# Patient Record
Sex: Male | Born: 1955 | Race: White | Hispanic: No | Marital: Married | Smoking: Current every day smoker
Health system: Southern US, Community
[De-identification: ages and names within clinical notes are randomized; demographics above are authoritative.]

## PROBLEM LIST (undated history)

## (undated) DIAGNOSIS — J449 Chronic obstructive pulmonary disease, unspecified: Secondary | ICD-10-CM

## (undated) DIAGNOSIS — F419 Anxiety disorder, unspecified: Secondary | ICD-10-CM

## (undated) DIAGNOSIS — I10 Essential (primary) hypertension: Secondary | ICD-10-CM

## (undated) DIAGNOSIS — E785 Hyperlipidemia, unspecified: Secondary | ICD-10-CM

## (undated) DIAGNOSIS — N200 Calculus of kidney: Secondary | ICD-10-CM

## (undated) DIAGNOSIS — K219 Gastro-esophageal reflux disease without esophagitis: Secondary | ICD-10-CM

## (undated) DIAGNOSIS — M1990 Unspecified osteoarthritis, unspecified site: Secondary | ICD-10-CM

## (undated) DIAGNOSIS — J439 Emphysema, unspecified: Secondary | ICD-10-CM

## (undated) HISTORY — DX: Unspecified osteoarthritis, unspecified site: M19.90

## (undated) HISTORY — DX: Gastro-esophageal reflux disease without esophagitis: K21.9

## (undated) HISTORY — DX: Chronic obstructive pulmonary disease, unspecified: J44.9

## (undated) HISTORY — DX: Hyperlipidemia, unspecified: E78.5

## (undated) HISTORY — DX: Gilbert syndrome: E80.4

## (undated) HISTORY — DX: Emphysema, unspecified: J43.9

## (undated) HISTORY — DX: Essential (primary) hypertension: I10

## (undated) HISTORY — PX: CATARACT EXTRACTION: SUR2

## (undated) HISTORY — PX: COLONOSCOPY W/ POLYPECTOMY: SHX1380

## (undated) HISTORY — DX: Anxiety disorder, unspecified: F41.9

## (undated) HISTORY — DX: Calculus of kidney: N20.0

---

## 1974-07-10 DIAGNOSIS — N200 Calculus of kidney: Secondary | ICD-10-CM

## 1974-07-10 HISTORY — DX: Calculus of kidney: N20.0

## 1982-07-10 HISTORY — PX: UVULECTOMY: SHX2631

## 1982-07-10 HISTORY — PX: TONSILLECTOMY: SUR1361

## 2002-07-10 HISTORY — PX: CARDIAC CATHETERIZATION: SHX172

## 2002-10-14 ENCOUNTER — Ambulatory Visit (HOSPITAL_COMMUNITY): Admission: RE | Admit: 2002-10-14 | Discharge: 2002-10-14 | Payer: Self-pay | Admitting: Cardiology

## 2005-01-20 ENCOUNTER — Ambulatory Visit: Payer: Self-pay | Admitting: Internal Medicine

## 2005-04-10 ENCOUNTER — Ambulatory Visit: Payer: Self-pay | Admitting: Cardiology

## 2006-04-16 ENCOUNTER — Ambulatory Visit: Payer: Self-pay | Admitting: Internal Medicine

## 2006-05-11 ENCOUNTER — Ambulatory Visit: Payer: Self-pay | Admitting: Internal Medicine

## 2006-06-04 ENCOUNTER — Ambulatory Visit: Payer: Self-pay | Admitting: Internal Medicine

## 2006-06-15 ENCOUNTER — Encounter: Admission: RE | Admit: 2006-06-15 | Discharge: 2006-06-15 | Payer: Self-pay | Admitting: Internal Medicine

## 2006-12-13 ENCOUNTER — Telehealth (INDEPENDENT_AMBULATORY_CARE_PROVIDER_SITE_OTHER): Payer: Self-pay | Admitting: *Deleted

## 2006-12-14 ENCOUNTER — Telehealth: Payer: Self-pay | Admitting: Internal Medicine

## 2007-05-06 ENCOUNTER — Ambulatory Visit: Payer: Self-pay | Admitting: Internal Medicine

## 2007-05-06 LAB — CONVERTED CEMR LAB
ALT: 27 units/L (ref 0–53)
AST: 20 units/L (ref 0–37)
BUN: 10 mg/dL (ref 6–23)
Cholesterol: 190 mg/dL (ref 0–200)
Creatinine, Ser: 1 mg/dL (ref 0.4–1.5)
Glucose, Bld: 114 mg/dL — ABNORMAL HIGH (ref 70–99)
HDL: 23.6 mg/dL — ABNORMAL LOW (ref >39.0)
Potassium: 4.2 meq/L (ref 3.5–5.1)
VLDL: 53 mg/dL — ABNORMAL HIGH (ref 0–40)

## 2007-05-09 ENCOUNTER — Ambulatory Visit: Payer: Self-pay | Admitting: Internal Medicine

## 2007-05-09 DIAGNOSIS — I10 Essential (primary) hypertension: Secondary | ICD-10-CM | POA: Insufficient documentation

## 2007-05-09 DIAGNOSIS — E1169 Type 2 diabetes mellitus with other specified complication: Secondary | ICD-10-CM | POA: Insufficient documentation

## 2007-05-09 DIAGNOSIS — E782 Mixed hyperlipidemia: Secondary | ICD-10-CM | POA: Insufficient documentation

## 2007-05-09 LAB — CONVERTED CEMR LAB
Cholesterol, target level: 200 mg/dL
LDL Goal: 130 mg/dL

## 2007-05-22 ENCOUNTER — Telehealth (INDEPENDENT_AMBULATORY_CARE_PROVIDER_SITE_OTHER): Payer: Self-pay | Admitting: *Deleted

## 2007-05-31 ENCOUNTER — Ambulatory Visit: Payer: Self-pay | Admitting: Internal Medicine

## 2007-05-31 DIAGNOSIS — F419 Anxiety disorder, unspecified: Secondary | ICD-10-CM | POA: Insufficient documentation

## 2007-05-31 DIAGNOSIS — F411 Generalized anxiety disorder: Secondary | ICD-10-CM

## 2007-05-31 DIAGNOSIS — N400 Enlarged prostate without lower urinary tract symptoms: Secondary | ICD-10-CM | POA: Insufficient documentation

## 2007-06-03 ENCOUNTER — Encounter (INDEPENDENT_AMBULATORY_CARE_PROVIDER_SITE_OTHER): Payer: Self-pay | Admitting: *Deleted

## 2007-08-27 ENCOUNTER — Telehealth (INDEPENDENT_AMBULATORY_CARE_PROVIDER_SITE_OTHER): Payer: Self-pay | Admitting: *Deleted

## 2007-09-24 ENCOUNTER — Ambulatory Visit: Payer: Self-pay | Admitting: Internal Medicine

## 2007-09-26 LAB — CONVERTED CEMR LAB
ALT: 33 units/L (ref 0–53)
AST: 30 units/L (ref 0–37)
Alkaline Phosphatase: 77 units/L (ref 39–117)
BUN: 15 mg/dL (ref 6–23)
CO2: 29 meq/L (ref 19–32)
Chloride: 105 meq/L (ref 96–112)
Creatinine, Ser: 1.1 mg/dL (ref 0.4–1.5)
Eosinophils Absolute: 0.2 10*3/uL (ref 0.0–0.6)
Eosinophils Relative: 1.5 % (ref 0.0–5.0)
Hemoglobin: 15.9 g/dL (ref 13.0–17.0)
Hgb A1c MFr Bld: 6.8 % — ABNORMAL HIGH (ref 4.6–6.0)
Lymphocytes Relative: 20.8 % (ref 12.0–46.0)
MCHC: 33 g/dL (ref 30.0–36.0)
MCV: 87.4 fL (ref 78.0–100.0)
Platelets: 284 10*3/uL (ref 150–400)
Potassium: 3.7 meq/L (ref 3.5–5.1)
RBC: 5.53 M/uL (ref 4.22–5.81)
RDW: 12.4 % (ref 11.5–14.6)
Total Bilirubin: 0.8 mg/dL (ref 0.3–1.2)
Total Protein: 7 g/dL (ref 6.0–8.3)
WBC: 10.1 10*3/uL (ref 4.5–10.5)

## 2007-09-27 ENCOUNTER — Encounter (INDEPENDENT_AMBULATORY_CARE_PROVIDER_SITE_OTHER): Payer: Self-pay | Admitting: *Deleted

## 2007-10-01 ENCOUNTER — Telehealth (INDEPENDENT_AMBULATORY_CARE_PROVIDER_SITE_OTHER): Payer: Self-pay | Admitting: *Deleted

## 2007-10-02 ENCOUNTER — Telehealth (INDEPENDENT_AMBULATORY_CARE_PROVIDER_SITE_OTHER): Payer: Self-pay | Admitting: *Deleted

## 2007-10-25 ENCOUNTER — Ambulatory Visit: Payer: Self-pay | Admitting: Internal Medicine

## 2008-01-17 ENCOUNTER — Ambulatory Visit: Payer: Self-pay | Admitting: Internal Medicine

## 2008-01-17 LAB — CONVERTED CEMR LAB
Cholesterol: 142 mg/dL (ref 0–200)
HDL: 23.6 mg/dL — ABNORMAL LOW (ref >39.0)
LDL Cholesterol: 79 mg/dL (ref 0–99)
Total CHOL/HDL Ratio: 6
VLDL: 40 mg/dL (ref 0–40)

## 2008-01-24 ENCOUNTER — Ambulatory Visit: Payer: Self-pay | Admitting: Internal Medicine

## 2008-01-24 DIAGNOSIS — G5600 Carpal tunnel syndrome, unspecified upper limb: Secondary | ICD-10-CM

## 2008-01-24 DIAGNOSIS — F528 Other sexual dysfunction not due to a substance or known physiological condition: Secondary | ICD-10-CM

## 2008-09-24 ENCOUNTER — Ambulatory Visit: Payer: Self-pay | Admitting: Internal Medicine

## 2008-09-24 DIAGNOSIS — Z860100 Personal history of colon polyps, unspecified: Secondary | ICD-10-CM | POA: Insufficient documentation

## 2008-09-24 DIAGNOSIS — E119 Type 2 diabetes mellitus without complications: Secondary | ICD-10-CM

## 2008-09-28 ENCOUNTER — Encounter (INDEPENDENT_AMBULATORY_CARE_PROVIDER_SITE_OTHER): Payer: Self-pay | Admitting: *Deleted

## 2008-09-29 ENCOUNTER — Encounter (INDEPENDENT_AMBULATORY_CARE_PROVIDER_SITE_OTHER): Payer: Self-pay | Admitting: *Deleted

## 2008-12-11 ENCOUNTER — Telehealth (INDEPENDENT_AMBULATORY_CARE_PROVIDER_SITE_OTHER): Payer: Self-pay | Admitting: *Deleted

## 2009-01-25 ENCOUNTER — Ambulatory Visit: Payer: Self-pay | Admitting: Internal Medicine

## 2009-01-31 LAB — CONVERTED CEMR LAB
Cholesterol: 172 mg/dL (ref 0–200)
Direct LDL: 88.7 mg/dL
HDL: 25.2 mg/dL — ABNORMAL LOW (ref >39.00)
Total CHOL/HDL Ratio: 7
Triglycerides: 389 mg/dL — ABNORMAL HIGH (ref 0.0–149.0)
VLDL: 77.8 mg/dL — ABNORMAL HIGH (ref 0.0–40.0)

## 2009-02-01 ENCOUNTER — Encounter (INDEPENDENT_AMBULATORY_CARE_PROVIDER_SITE_OTHER): Payer: Self-pay | Admitting: *Deleted

## 2009-10-01 ENCOUNTER — Ambulatory Visit: Payer: Self-pay | Admitting: Internal Medicine

## 2009-10-01 DIAGNOSIS — Z87442 Personal history of urinary calculi: Secondary | ICD-10-CM

## 2009-10-01 DIAGNOSIS — F17200 Nicotine dependence, unspecified, uncomplicated: Secondary | ICD-10-CM

## 2010-05-02 ENCOUNTER — Encounter: Payer: Self-pay | Admitting: Internal Medicine

## 2010-08-07 LAB — CONVERTED CEMR LAB
ALT: 57 units/L — ABNORMAL HIGH (ref 0–53)
AST: 21 units/L (ref 0–37)
Albumin: 4 g/dL (ref 3.5–5.2)
BUN: 15 mg/dL (ref 6–23)
BUN: 16 mg/dL (ref 6–23)
Basophils Absolute: 0.1 10*3/uL (ref 0.0–0.1)
Basophils Relative: 1 % (ref 0.0–3.0)
Bilirubin Urine: NEGATIVE
CO2: 26 meq/L (ref 19–32)
CO2: 28 meq/L (ref 19–32)
Calcium: 9.1 mg/dL (ref 8.4–10.5)
Chloride: 106 meq/L (ref 96–112)
Chloride: 108 meq/L (ref 96–112)
Cholesterol: 148 mg/dL (ref 0–200)
Creatinine, Ser: 1.1 mg/dL (ref 0.4–1.5)
Creatinine,U: 126.1 mg/dL
Creatinine,U: 137.4 mg/dL
Direct LDL: 90.5 mg/dL
Eosinophils Absolute: 0.1 10*3/uL (ref 0.0–0.7)
Eosinophils Relative: 2.2 % (ref 0.0–5.0)
Glucose, Bld: 126 mg/dL — ABNORMAL HIGH (ref 70–99)
Glucose, Bld: 79 mg/dL (ref 70–99)
HCT: 45.5 % (ref 39.0–52.0)
HCT: 46.4 % (ref 39.0–52.0)
Hemoglobin: 15.9 g/dL (ref 13.0–17.0)
Hgb A1c MFr Bld: 6 % (ref 4.6–6.5)
Ketones, urine, test strip: NEGATIVE
Lymphocytes Relative: 27.5 % (ref 12.0–46.0)
Lymphocytes Relative: 28.7 % (ref 12.0–46.0)
Lymphs Abs: 1.8 10*3/uL (ref 0.7–4.0)
Microalb Creat Ratio: 5.6 mg/g (ref 0.0–30.0)
Microalb Creat Ratio: 7.3 mg/g (ref 0.0–30.0)
Microalb, Ur: 0.7 mg/dL (ref 0.0–1.9)
Microalb, Ur: 1 mg/dL (ref 0.0–1.9)
Monocytes Absolute: 0.6 10*3/uL (ref 0.1–1.0)
Monocytes Relative: 7.6 % (ref 3.0–12.0)
Neutro Abs: 4.2 10*3/uL (ref 1.4–7.7)
Neutrophils Relative %: 61 % (ref 43.0–77.0)
Neutrophils Relative %: 61.9 % (ref 43.0–77.0)
Nitrite: NEGATIVE
PSA: 0.46 ng/mL (ref 0.10–4.00)
PSA: 0.67 ng/mL (ref 0.10–4.00)
Platelets: 254 10*3/uL (ref 150.0–400.0)
Potassium: 4 meq/L (ref 3.5–5.1)
Potassium: 4.2 meq/L (ref 3.5–5.1)
RBC: 5.27 M/uL (ref 4.22–5.81)
RDW: 13.1 % (ref 11.5–14.6)
Sodium: 142 meq/L (ref 135–145)
Sodium: 143 meq/L (ref 135–145)
TSH: 1.66 microintl units/mL (ref 0.35–5.50)
Total Bilirubin: 0.4 mg/dL (ref 0.3–1.2)
Total CHOL/HDL Ratio: 5
Total Protein: 6.8 g/dL (ref 6.0–8.3)
Triglycerides: 229 mg/dL — ABNORMAL HIGH (ref 0.0–149.0)
Triglycerides: 277 mg/dL — ABNORMAL HIGH (ref 0.0–149.0)
VLDL: 45.8 mg/dL — ABNORMAL HIGH (ref 0.0–40.0)
WBC Urine, dipstick: NEGATIVE
WBC: 6.7 10*3/uL (ref 4.5–10.5)
pH: 6.5

## 2010-08-09 NOTE — Assessment & Plan Note (Signed)
Summary: CPX WITH FASTING LABS/N/S/SCM   Vital Signs:  Patient profile:   55 year old male Height:      72 inches Weight:      223 pounds BMI:     30.35 Temp:     97.8 degrees F oral Pulse rate:   64 / minute Resp:     14 per minute BP sitting:   128 / 70  (left arm) Cuff size:   large  Vitals Entered By: Shonna Chock (October 01, 2009 8:30 AM)  CC: CPX with fasting labs , General Medical Evaluation Comments REVIEWED MED LIST, PATIENT AGREED DOSE AND INSTRUCTION CORRECT    CC:  CPX with fasting labs  and General Medical Evaluation.  History of Present Illness: [name] is here for a physical; he is asymptomatic. Not checking glucoses; no hypoglycemia or significant weight change.  Preventive Screening-Counseling & Management  Caffeine-Diet-Exercise     Does Patient Exercise: no  Allergies (verified): No Known Drug Allergies  Past History:  Past Medical History: Hyperlipidemia Hypertension Gilbert's Syndrome; isolated Alkaline  Phosphatase elevation 2001 Diabetes mellitus, type II Colonic polyps, hx of, 2007, Dr Marina Goodell Nephrolithiasis, hx of  Past Surgical History: Catheterization  2004 mild CAD; uvulectomy for  Sleep Apnea (OSA) with Tonsillectomy renal calculi, passed 1976 Colon polypectomy 2007, due 2012  Family History: Father: prostate cancer, dysrhythmia Mother: HTN, DM, Alsheimer's Siblings: none; M uncle CBAG; M aunt COAD,CAD; M uncle MI @ 90; M aunt Alsheimer's; P aunts cancer ? primary  Social History: Current Smoker:1 ppd No specific diet but decreased meat & sugar Occupation: Garment/textile technologist Married Alcohol use-no Regular exercise-no Does Patient Exercise:  no  Review of Systems General:  Denies fatigue, sleep disorder, and weight loss. Eyes:  Denies blurring, double vision, and vision loss-both eyes; Last exam 2008. ENT:  Denies difficulty swallowing and hoarseness. CV:  Denies chest pain or discomfort, difficulty breathing at night,  difficulty breathing while lying down, leg cramps with exertion, lightheadness, near fainting, palpitations, swelling of feet, and swelling of hands; DOE with high level exercise. Resp:  Complains of cough; denies chest pain with inspiration, coughing up blood, excessive snoring, hypersomnolence, morning headaches, shortness of breath, and sputum productive; Slight wheezing. GI:  Denies abdominal pain, bloody stools, dark tarry stools, and indigestion. GU:  Denies discharge, dysuria, and hematuria. MS:  Denies joint pain, low back pain, mid back pain, and thoracic pain. Derm:  Denies changes in nail beds, dryness, hair loss, lesion(s), and poor wound healing. Neuro:  Complains of numbness; denies tingling; Numbness in LUE after sleeping in LLDP with LUE behind head. Psych:  Denies anxiety and depression. Endo:  Denies cold intolerance, excessive hunger, excessive thirst, excessive urination, and heat intolerance. Heme:  Denies abnormal bruising and bleeding. Allergy:  Denies itching eyes and sneezing.  Physical Exam  General:  well-nourished; alert,appropriate and cooperative throughout examination Head:  Normocephalic and atraumatic without obvious abnormalities. Eyes:  No corneal or conjunctival inflammation noted. Perrla. Funduscopic exam benign, without hemorrhages, exudates or papilledema. Ears:  External ear exam shows no significant lesions or deformities.  Otoscopic examination reveals clear canals, tympanic membranes are intact bilaterally without bulging, retraction, inflammation or discharge. Hearing is grossly normal bilaterally.TMs scarred Nose:  External nasal examination shows no deformity or inflammation. Nasal mucosa are pink and moist without lesions or exudates. Mouth:  Oral mucosa and oropharynx without lesions or exudates.  Teeth in  poor  repair; caries of  L upper tooth . Neck:  No  deformities, masses, or tenderness noted. Lungs:  Normal respiratory effort, chest expands  symmetrically. Lungs are clear to auscultation, no crackles or wheezes. Heart:  Normal rate and regular rhythm. S1 and S2 normal without gallop, murmur, click, rub .S4 Abdomen:  Bowel sounds positive,abdomen soft and non-tender without masses, organomegaly or hernias noted. Rectal:  External tags  noted. Normal sphincter tone. No rectal masses or tenderness. Genitalia:  Testes bilaterally descended without nodularity, tenderness or masses. No scrotal masses or lesions. No penis lesions or urethral discharge. Prostate:  Prostate gland firm and smooth, no enlargement, nodularity, tenderness, mass, asymmetry or induration. Msk:  No deformity or scoliosis noted of thoracic or lumbar spine.   Pulses:  R and L carotid,radial,femoral,dorsalis pedis and posterior tibial pulses are full and equal bilaterally Extremities:  No clubbing, cyanosis, edema, or deformity noted with normal full range of motion of all joints. Mild crepitus knees.    Neurologic:  alert & oriented X3, sensation intact to light touch over feet, gait normal, and DTRs symmetrical and normal.   Skin:  2X2 cm verrucoid lesion over dorsal  R 3rd MCP joint Cervical Nodes:  No lymphadenopathy noted Axillary Nodes:  No palpable lymphadenopathy Inguinal Nodes:  No significant adenopathy Psych:  memory intact for recent and remote, normally interactive, and good eye contact.     Impression & Recommendations:  Problem # 1:  ROUTINE GENERAL MEDICAL EXAM@HEALTH  CARE FACL (ICD-V70.0)  Orders: EKG w/ Interpretation (93000) Venipuncture (27253) TLB-Lipid Panel (80061-LIPID) TLB-BMP (Basic Metabolic Panel-BMET) (80048-METABOL) TLB-CBC Platelet - w/Differential (85025-CBCD) TLB-Hepatic/Liver Function Pnl (80076-HEPATIC) TLB-TSH (Thyroid Stimulating Hormone) (84443-TSH) TLB-Microalbumin/Creat Ratio, Urine (82043-MALB) TLB-A1C / Hgb A1C (Glycohemoglobin) (83036-A1C) TLB-PSA (Prostate Specific Antigen) (84153-PSA)  Problem # 2:  DIABETES  MELLITUS, TYPE II (ICD-250.00)  His updated medication list for this problem includes:    Lisinopril 10 Mg Tabs (Lisinopril) .Marland Kitchen... Take one tablet daily    Metformin Hcl 500 Mg Tb24 (Metformin hcl) .Marland Kitchen... 1 two times a day with 2 largest meals  Orders: Venipuncture (66440) TLB-Microalbumin/Creat Ratio, Urine (82043-MALB) TLB-A1C / Hgb A1C (Glycohemoglobin) (83036-A1C)  Problem # 3:  HYPERTENSION, ESSENTIAL NOS (ICD-401.9)  controlled His updated medication list for this problem includes:    Lisinopril 10 Mg Tabs (Lisinopril) .Marland Kitchen... Take one tablet daily  Orders: Venipuncture (34742)  Problem # 4:  HYPERLIPIDEMIA (ICD-272.2)  His updated medication list for this problem includes:    Niaspan 500 Mg Tbcr (Niacin (antihyperlipidemic)) .Marland Kitchen... Take one tablet daily    Crestor 20 Mg Tabs (Rosuvastatin calcium) .Marland Kitchen... 1 once daily mostly  Orders: Venipuncture (59563) TLB-Lipid Panel (80061-LIPID)  Problem # 5:  SMOKER (ICD-305.1)   CV risks  discussed  Orders: Venipuncture (87564)  Problem # 6:  HYPERPLASIA PROSTATE UNS W/O UR OBST & OTH LUTS (ICD-600.90)  PMH of,not documented on DRE. FH prostate cancer  Orders: Venipuncture (33295) TLB-PSA (Prostate Specific Antigen) (84153-PSA)  Problem # 7:  FAMILY HISTORY OF MALIGNANT NEOPLASM PROSTATE (ICD-V16.42)  Orders: Venipuncture (18841) TLB-PSA (Prostate Specific Antigen) (84153-PSA)  Complete Medication List: 1)  Niaspan 500 Mg Tbcr (Niacin (antihyperlipidemic)) .... Take one tablet daily 2)  Advair Diskus 100-50 Mcg/dose Misc (Fluticasone-salmeterol) .... Use one puff every 12 hours as needed 3)  Lisinopril 10 Mg Tabs (Lisinopril) .... Take one tablet daily 4)  Ecasa 81 Mg  .... Once daily 5)  Folic Acid  .... Prn 6)  Crestor 20 Mg Tabs (Rosuvastatin calcium) .Marland Kitchen.. 1 once daily mostly 7)  Metformin Hcl 500 Mg Tb24 (Metformin hcl) .Marland KitchenMarland KitchenMarland Kitchen 1  two times a day with 2 largest meals 8)  Freestyle Lite Strp (Glucose blood) .... Test  one time daily 9)  Freestyle Lancets Misc (Lancets) .... Use one daily as directed 10)  Levitra 20 Mg Tabs (Vardenafil hcl) .... 1/2 -1 once daily prn 11)  Doxycycline Hyclate 100 Mg Tabs (Doxycycline hyclate) .... Take 1 tab  two times a day. avoid direct sun on this med.  Patient Instructions: 1)  Please have caries addressed. 2)  Stop Smoking Tips: Choose a Quit date. Cut down before the Quit date. decide what you will do as a substitute when you feel the urge to smoke(gum,toothpick,exercise). 3)  Take an Aspirin every day. 4)  Check your blood sugars regularly. If your readings are usually above :150  or below 90 you should contact our office. 5)  See your eye doctor yearly to check for diabetic eye damage. 6)  Check your feet each night for sore areas, calluses or signs of infection. Prescriptions: FREESTYLE LANCETS   MISC (LANCETS) use one daily as directed  #100 x 3   Entered and Authorized by:   Marga Melnick MD   Signed by:   Marga Melnick MD on 10/01/2009   Method used:   Print then Give to Patient   RxID:   [id number] FREESTYLE LITE   STRP (GLUCOSE BLOOD) test one time daily  #100 x 3   Entered and Authorized by:   Marga Melnick MD   Signed by:   Marga Melnick MD on 10/01/2009   Method used:   Print then Give to Patient   RxID:   [id number] METFORMIN HCL 500 MG  TB24 (METFORMIN HCL) 1 two times a day with 2 largest meals  #180 Each x 0   Entered and Authorized by:   Marga Melnick MD   Signed by:   Marga Melnick MD on 10/01/2009   Method used:   Print then Give to Patient   RxID:   [id number] CRESTOR 20 MG  TABS (ROSUVASTATIN CALCIUM) 1 once daily mostly  #30 x 11   Entered and Authorized by:   Marga Melnick MD   Signed by:   Marga Melnick MD on 10/01/2009   Method used:   Print then Give to Patient   RxID:   [id number] LISINOPRIL 10 MG  TABS (LISINOPRIL) take one tablet daily  #90 x 3   Entered and Authorized by:   Marga Melnick MD    Signed by:   Marga Melnick MD on 10/01/2009   Method used:   Print then Give to Patient   RxID:   [id number] ADVAIR DISKUS 100-50 MCG/DOSE  MISC (FLUTICASONE-SALMETEROL) use one puff every 12 hours as needed  #1 x 11   Entered and Authorized by:   Marga Melnick MD   Signed by:   Marga Melnick MD on 10/01/2009   Method used:   Print then Give to Patient   RxID:   [id number] NIASPAN 500 MG  TBCR (NIACIN (ANTIHYPERLIPIDEMIC)) take one tablet daily  #90 x 3   Entered and Authorized by:   Marga Melnick MD   Signed by:   Marga Melnick MD on 10/01/2009   Method used:   Print then Give to Patient   RxID:   [id number]

## 2010-08-12 NOTE — Medication Information (Signed)
Summary: Letter Regarding Adding a Statin/[facility]  Letter Regarding Adding a Statin/[facility]   Imported By: Lanelle Bal 06/01/2010 12:27:56  _____________________________________________________________________  External Attachment:    Type:   Image     Comment:   External Document

## 2010-09-03 ENCOUNTER — Encounter: Payer: Self-pay | Admitting: Internal Medicine

## 2010-10-13 ENCOUNTER — Encounter: Payer: Self-pay | Admitting: Internal Medicine

## 2010-10-13 ENCOUNTER — Ambulatory Visit (INDEPENDENT_AMBULATORY_CARE_PROVIDER_SITE_OTHER): Payer: BC Managed Care – PPO | Admitting: Internal Medicine

## 2010-10-13 VITALS — BP 122/68 | HR 72 | Temp 98.5°F | Resp 14 | Ht 72.0 in | Wt 213.6 lb

## 2010-10-13 DIAGNOSIS — E782 Mixed hyperlipidemia: Secondary | ICD-10-CM

## 2010-10-13 DIAGNOSIS — I10 Essential (primary) hypertension: Secondary | ICD-10-CM

## 2010-10-13 DIAGNOSIS — E785 Hyperlipidemia, unspecified: Secondary | ICD-10-CM

## 2010-10-13 DIAGNOSIS — F17200 Nicotine dependence, unspecified, uncomplicated: Secondary | ICD-10-CM

## 2010-10-13 DIAGNOSIS — Z0000 Encounter for general adult medical examination without abnormal findings: Secondary | ICD-10-CM

## 2010-10-13 DIAGNOSIS — E119 Type 2 diabetes mellitus without complications: Secondary | ICD-10-CM

## 2010-10-13 DIAGNOSIS — N400 Enlarged prostate without lower urinary tract symptoms: Secondary | ICD-10-CM

## 2010-10-13 LAB — CBC WITH DIFFERENTIAL/PLATELET
Basophils Relative: 1 % (ref 0.0–3.0)
Eosinophils Relative: 1.6 % (ref 0.0–5.0)
Lymphocytes Relative: 24.6 % (ref 12.0–46.0)
Lymphs Abs: 2.1 10*3/uL (ref 0.7–4.0)
MCV: 87.9 fl (ref 78.0–100.0)
Monocytes Absolute: 0.7 10*3/uL (ref 0.1–1.0)
Monocytes Relative: 7.8 % (ref 3.0–12.0)
Neutro Abs: 5.6 10*3/uL (ref 1.4–7.7)
Neutrophils Relative %: 65 % (ref 43.0–77.0)
Platelets: 323 10*3/uL (ref 150.0–400.0)
RBC: 5.51 Mil/uL (ref 4.22–5.81)
RDW: 14 % (ref 11.5–14.6)
WBC: 8.6 10*3/uL (ref 4.5–10.5)

## 2010-10-13 LAB — HEPATIC FUNCTION PANEL
Alkaline Phosphatase: 68 U/L (ref 39–117)
Total Bilirubin: 1 mg/dL (ref 0.3–1.2)
Total Protein: 6.8 g/dL (ref 6.0–8.3)

## 2010-10-13 LAB — LIPID PANEL: Triglycerides: 283 mg/dL — ABNORMAL HIGH (ref 0.0–149.0)

## 2010-10-13 LAB — BASIC METABOLIC PANEL
Calcium: 9.5 mg/dL (ref 8.4–10.5)
Chloride: 105 mEq/L (ref 96–112)
Potassium: 4.9 mEq/L (ref 3.5–5.1)
Sodium: 141 mEq/L (ref 135–145)

## 2010-10-13 LAB — HEMOGLOBIN A1C: Hgb A1c MFr Bld: 6.4 % (ref 4.6–6.5)

## 2010-10-13 MED ORDER — LISINOPRIL 10 MG PO TABS: 10.0000 mg | ORAL_TABLET | Freq: Every day | ORAL | Status: DC

## 2010-10-13 NOTE — Patient Instructions (Signed)
Please think about quitting smoking. Review the risks we discussed. Please call 1-800-QUIT-NOW 956-067-0951) for free smoking cessation counseling.   Preventive Health Care: Exercise at least 30-45 minutes a day,  3-4 days a week.  Eat a low-fat diet with lots of fruits and vegetables, up to 7-9 servings per day. Avoid obesity; your goal is waist measurement < 40 inches.Consume less than 40 grams of sugar per day from foods & drinks with High Fructose Corn Sugar as #2,3 or # 4 on label. Seatbelts can save your life. Wear them always. Smoke detectors on every level of your home, check batteries every year. Eye Doctor - have an eye exam @ least annually.     Please see your Dentist ASAP.                                                                                  Diabetes Monitor   The A1c test checks the average amount of glucose (sugar) in the blood over the last 2 to 3 months.As glucose circulates in the blood, some of it binds to hemoglobin A. This is the main form of hemoglobin in adults. Hemoglobin is a red protein that carries oxygen in the red blood cells (RBC's). Once the glucose is bound to the hemoglobin A, it remains there for the life of the red blood cell (about 120 days). This combination of glucose and hemoglobin A is called A1c (or hemoglobin A1c or glycohemoglobin). Increased glucose in the blood, increases the hemoglobin A1c. A1c levels do not change quickly but will shift as older RBC's die and younger ones take their place.   The A1c test is used primarily to monitor the glucose control of diabetics over time. The goal of those with diabetes is to keep their blood glucose levels as close to normal as possible. This helps to minimize the complications caused by chronically elevated glucose levels, such as progressive damage to body organs like the kidneys, eyes, cardiovascular system, and nerves. The A1c test gives a picture of the average amount of glucose in the blood over the  last few months. It can help a patient and his doctor know if the measures they are taking to control the patient's diabetes are successful or need to be adjusted.     Depending on the type of diabetes that you have, how well your diabetes is controlled, your A1c may be measured 2 to 4 times each year. When someone is first diagnosed with diabetes or if control is not good, A1c may be ordered more frequently.   NORMAL VALUES  Non diabetic adults: 5 %-6.1%  Good diabetic control: 6.2-6.4 %  Fair diabetic control: 6.5-7%  Poor diabetic control: greater than 7 % ( except with additional factors such as  advanced age; significant coronary or neurologic disease,etc). Check the A1c every 6 months if it is < 6.5%; every 4 months if  6.5% or higher. Goals for home glucose monitoring are : fasting  or morning glucose goal of  90-150. Two hours after any meal , goal = < 180, preferably < 150.

## 2010-10-13 NOTE — Progress Notes (Signed)
Subjective:    Patient ID: JHOEL STIEG, male    DOB: [birth year], 55 y.o.   MRN: [id number]  HPI  He is here for a physical; he denies symptoms.  HYPERTENSION  Disease Monitoring: Blood pressure range-118/68 average Chest pain, palpitations- no      Dyspnea- no  Medications: Compliance- yes Lightheadedness,Syncope- no   Edema- no   DIABETES  Disease Monitoring: Blood Sugar ranges-wife has record; he is unsure of values Polyuria/phagia/dipsia- no      Visual problems- no  Medications: Compliance- yes Hypoglycemic symptoms- no    HYPERLIPIDEMIA  Disease Monitoring: See symptoms for Hypertension  Medications: Compliance- Niaspan causes "joint pain" , taken irregularly. Abd pain, bowel changes- no  Muscle aches- no( see above)       Smoking Status :1 ppd       Review of Systems Patient reports no  vision/ hearing changes,anorexia, weight change, fever ,adenopathy, persistant / recurrent hoarseness, swallowing issues, persistant / recurrent cough, hemoptysis,  paroxysmal nocturnal), gastrointestinal  bleeding (melena, rectal bleeding), abdominal pain, excessive heart burn, GU symptoms( dysuria, hematuria, pyuria, voiding/incontinence  issues)  focal weakness, memory loss,numbness & tingling, skin/hair/nail changes,depression, anxiety, abnormal bruising/bleeding, musculoskeletal symptoms/signs.      Objective:   Physical Exam General appearance is one of good nourishment. Skull is normocephalic without lymphadenopathy about the head, neck, or axilla. See current vital signs Eye - Pupils Equal Round Reactive to light, Extraocular movements intact, Fundi without hemorrhage or visible lesions, Conjunctiva without redness or discharge.Ears:  External ear exam shows no significant lesions or deformities.  Otoscopic examination reveals clear canals, tympanic membranes are intact bilaterally without bulging, retraction, inflammation or discharge. Hearing is grossly normal  bilaterally Oral exam: Dental hygiene is fair with isolated caries; lips and gums are healthy appearing.There is no oropharyngeal erythema or exudate noted. Marland KitchenHeart:  Normal rate and regular rhythm. S1 and S2 normal without gallop, murmur, click, rub or other extra sounds.                                                                                                      Lungs: low grade musical wheezes w/o  rhonchi,rales ,or rubs present.No increased work of breathing. Abdomen: bowel sounds normal, soft and non-tender without masses, organomegaly or hernias noted.  No guarding or rebound Genitourinary: Testicles are normal with no associated lesions. The penis is uncircumsized, mild glans  erythema. Rectal tone is normal. The prostate is  Flat & firm w/o nodule present.Musculoskeletal/Extremities: Gait and station; range of motion; stability; muscle strength; and  muscle tone are normal. Nail health is good. There are  no significant deformities of the digits. No cyanosis, clubbing or edema are present.  Neuro: sensation to light touch over feet was normal ; deep tendon reflexes were normal; gait  normal; balance normal.Skin:  Intact without suspicious lesions ; candida in groin. Warty growth dorsum R hand @ 3rd R MCP.Psych:  Cognition and judgment appear intact. Alert, communicative  and cooperative with normal attention span and concentration.  Assessment & Plan:  #1 comprehensive physical exam. Issues which  need immediate attention would be the caries (risks discussed). Smoking cessation was discussed but he is not ready to do this. The risks were reviewed. Changes in the prostate and the family history necessitate  PSA monitor .   #2 diabetes  #3 hypertension, controlled  #4 lipidemia  #5 probable Candida dermatitis.  Plan: Extensive labs to evaluate risk outlined above.

## 2010-10-15 ENCOUNTER — Other Ambulatory Visit: Payer: Self-pay | Admitting: Internal Medicine

## 2010-10-19 ENCOUNTER — Encounter: Payer: Self-pay | Admitting: Internal Medicine

## 2010-10-24 ENCOUNTER — Other Ambulatory Visit: Payer: Self-pay | Admitting: Internal Medicine

## 2010-11-14 ENCOUNTER — Other Ambulatory Visit: Payer: Self-pay | Admitting: Internal Medicine

## 2010-11-14 MED ORDER — ROSUVASTATIN CALCIUM 20 MG PO TABS: 20.0000 mg | ORAL_TABLET | Freq: Every day | ORAL | Status: DC

## 2010-11-25 NOTE — Cardiovascular Report (Signed)
   Shane Adams, Shane Adams                          ACCOUNT NO.:  [id number]   MEDICAL RECORD NO.:  [id number]                   PATIENT TYPE:  OIB   LOCATION:  2899                                 FACILITY:  [facility]   PHYSICIAN:  Salvadore Farber, M.D. [facility]         DATE OF BIRTH:  May 29, 1956   DATE OF PROCEDURE:  10/14/2002  DATE OF DISCHARGE:                              CARDIAC CATHETERIZATION   PROCEDURE:  Left heart catheterization, left ventriculography, coronary  angiography.   INDICATIONS:  The patient is a 55 year old gentleman with longstanding  tobacco use who presents with dyspnea.  After electrocardiogram at physical  examination demonstrated nonspecific ST-T abnormalities, he was referred for  exercise tolerance testing that demonstrated a questionable apical defect.  He was therefore referred for diagnostic angiography.   DIAGNOSTIC TECHNIQUE:  Informed consent was obtained.  Under 1% lidocaine  local anesthesia, a 6-French sheath was placed in the right femoral artery  using the modified Seldinger technique.  Coronary angiography and  ventriculography were performed using JL4, JR4, and pigtail catheters.  The  patient tolerated the procedure well and was transferred to the holding room  in stable condition.  Sheaths are to be removed there.   COMPLICATIONS:  None.   FINDINGS:  1. LV:  135/10/17.  EF:  62% without regional wall motion abnormalities.  2. No aortic stenosis or mitral regurgitation.  3. Left Main:  Angiographically normal.  4. LAD:  The LAD is a moderate-sized vessel, giving rise to a single     diagonal branch.  There is a 30% stenosis of the proximal LAD.  5. Ramus intermedius:  A small vessel which is angiographically normal.  6. Circumflex:  The circumflex is a large, dominant vessel.  It gives rise     to 2 obtuse marginal branches in the PDA.  It is angiographically normal.  7. RCA:  A very small nondominant vessel without significant disease.   IMPRESSION AND RECOMMENDATIONS:  The patient has mild coronary artery  disease with normal left ventricular pressures and systolic function.  These  do not explain his exertional dyspnea.  Nonetheless, he does have mild  disease.  The aggressive, preventative efforts underway should certainly be  continued.  I emphasized to the patient the critical importance of smoking  cessation.                                               Salvadore Farber, M.D. [facility]    WED/MEDQ  D:  10/14/2002  T:  10/15/2002  Job:  [id number]   cc:   Titus Dubin. Alwyn Ren, M.D. [facility]

## 2011-03-16 ENCOUNTER — Other Ambulatory Visit: Payer: Self-pay | Admitting: Internal Medicine

## 2011-05-16 ENCOUNTER — Encounter: Payer: Self-pay | Admitting: Internal Medicine

## 2011-05-26 ENCOUNTER — Encounter: Payer: Self-pay | Admitting: Internal Medicine

## 2011-06-13 ENCOUNTER — Ambulatory Visit (AMBULATORY_SURGERY_CENTER): Payer: BC Managed Care – HMO | Admitting: *Deleted

## 2011-06-13 VITALS — Ht 72.0 in | Wt 205.0 lb

## 2011-06-13 DIAGNOSIS — Z1211 Encounter for screening for malignant neoplasm of colon: Secondary | ICD-10-CM

## 2011-06-13 MED ORDER — METOCLOPRAMIDE HCL 10 MG PO TABS: 10.0000 mg | ORAL_TABLET | ORAL | Status: DC

## 2011-06-13 MED ORDER — PEG-KCL-NACL-NASULF-NA ASC-C 100 G PO SOLR: ORAL | Status: DC

## 2011-06-27 ENCOUNTER — Encounter: Payer: Self-pay | Admitting: Internal Medicine

## 2011-06-27 ENCOUNTER — Ambulatory Visit (AMBULATORY_SURGERY_CENTER): Payer: BC Managed Care – HMO | Admitting: Internal Medicine

## 2011-06-27 VITALS — BP 149/69 | HR 78 | Temp 97.1°F | Resp 15 | Ht 72.0 in | Wt 205.0 lb

## 2011-06-27 DIAGNOSIS — D126 Benign neoplasm of colon, unspecified: Secondary | ICD-10-CM

## 2011-06-27 DIAGNOSIS — Z1211 Encounter for screening for malignant neoplasm of colon: Secondary | ICD-10-CM

## 2011-06-27 DIAGNOSIS — Z86010 Personal history of colonic polyps: Secondary | ICD-10-CM

## 2011-06-27 MED ORDER — SODIUM CHLORIDE 0.9 % IV SOLN: 500.0000 mL | INTRAVENOUS | Status: DC

## 2011-06-27 NOTE — Patient Instructions (Signed)
HANDOUTS ON POLYPS AND HEMORRHOIDS  DISCHARGE INSTRUCTIONS PER BLUE AND GREEN SHEETS  WE WILL MAIL YOU A LETTER IN 1-2 WEEKS WITH THE PATHOLOGY RESULTS AND DR PERRY'S RECOMMENDATIONS

## 2011-06-27 NOTE — Progress Notes (Signed)
Patient did not experience any of the following events: a burn prior to discharge; a fall within the facility; wrong site/side/patient/procedure/implant event; or a hospital transfer or hospital admission upon discharge from the facility. (G8907) Patient did not have preoperative order for IV antibiotic SSI prophylaxis. (G8918)  

## 2011-06-27 NOTE — Op Note (Signed)
[facility] [address] [place], [place]  [address]  COLONOSCOPY PROCEDURE REPORT  PATIENT:  Shane Adams, Shane Adams  MR#:  [id number] BIRTHDATE:  [birth year], 55 yrs. old  GENDER:  male ENDOSCOPIST:  Wilhemina Bonito. Eda Keys, MD REF. BY:  Surveillance Program Recall, PROCEDURE DATE:  06/27/2011 PROCEDURE:  Colonoscopy with snare polypectomy x 3 ASA CLASS:  Class II INDICATIONS:  history of pre-cancerous (adenomatous) colon polyps, surveillance and high-risk screening ; index 05-2006 w/ multiple adenomas MEDICATIONS:   MAC sedation, administered by CRNA, propofol (Diprivan) 300 mg IV  DESCRIPTION OF PROCEDURE:   After the risks benefits and alternatives of the procedure were thoroughly explained, informed consent was obtained.  Digital rectal exam was performed and revealed no abnormalities.   The LB CF-[id number] endoscope was introduced through the anus and advanced to the cecum, which was identified by both the appendix and ileocecal valve, without limitations.  The quality of the prep was excellent, using MoviPrep.  The instrument was then slowly withdrawn as the colon was fully examined. <<PROCEDUREIMAGES>>  FINDINGS:  Three polyps, < 1 cm, were found in the ascending (2) and transverse colon. Polyps were snared without cautery. Retrieval was successful. Otherwise normal colonoscopy without other polyps, masses, vascular ectasias, or inflammatory changes. Retroflexed views in the rectum revealed internal hemorrhoids. The time to cecum = 2  minutes. The scope was then withdrawn in 13:56 minutes from the cecum and the procedure completed.  COMPLICATIONS:  None  ENDOSCOPIC IMPRESSION: 1) Three polyps in the  colon - removed 2) Otherwise normal colonoscopy 3) Internal hemorrhoids  RECOMMENDATIONS: 1) Follow up colonoscopy in 5 years  ______________________________ Wilhemina Bonito. Eda Keys, MD  CC:  Pecola Lawless, MD;  The Patient  n. eSIGNED:   Wilhemina Bonito. Eda Keys at  06/27/2011 08:59 AM  Lenda Kelp, [phone]

## 2011-06-28 ENCOUNTER — Telehealth: Payer: Self-pay

## 2011-06-28 NOTE — Telephone Encounter (Signed)
Left message on answering machine. 

## 2011-09-10 ENCOUNTER — Other Ambulatory Visit: Payer: Self-pay | Admitting: Internal Medicine

## 2011-09-11 NOTE — Telephone Encounter (Signed)
Prescription sent for 30 days.  Patient is due for CPX 10/2011.

## 2011-09-25 ENCOUNTER — Telehealth: Payer: Self-pay | Admitting: Internal Medicine

## 2011-09-25 NOTE — Telephone Encounter (Signed)
Can you send another prescription for 60-days of Metformin to get patient thru thi his May Physical  Also needs lisinopril 90-days if possible  Pharmacy

## 2011-09-26 MED ORDER — LISINOPRIL 10 MG PO TABS: ORAL_TABLET | ORAL | Status: DC

## 2011-09-26 MED ORDER — METFORMIN HCL 500 MG PO TABS: ORAL_TABLET | ORAL | Status: DC

## 2011-09-26 NOTE — Telephone Encounter (Signed)
RXs sent.

## 2011-10-15 ENCOUNTER — Other Ambulatory Visit: Payer: Self-pay | Admitting: Internal Medicine

## 2011-10-16 ENCOUNTER — Other Ambulatory Visit: Payer: Self-pay | Admitting: *Deleted

## 2011-10-16 MED ORDER — METFORMIN HCL 500 MG PO TABS: ORAL_TABLET | ORAL | Status: DC

## 2011-10-16 NOTE — Telephone Encounter (Signed)
Rx sent 

## 2011-11-15 ENCOUNTER — Other Ambulatory Visit: Payer: Self-pay | Admitting: Internal Medicine

## 2011-11-15 MED ORDER — LISINOPRIL 10 MG PO TABS: ORAL_TABLET | ORAL | Status: DC

## 2011-11-15 NOTE — Telephone Encounter (Signed)
Addended by: Maurice Small on: 11/15/2011 05:02 PM   Modules accepted: Orders

## 2011-11-28 ENCOUNTER — Other Ambulatory Visit: Payer: Self-pay | Admitting: Internal Medicine

## 2011-11-30 ENCOUNTER — Encounter: Payer: BC Managed Care – HMO | Admitting: Internal Medicine

## 2011-12-12 ENCOUNTER — Encounter: Payer: Self-pay | Admitting: Internal Medicine

## 2011-12-12 ENCOUNTER — Encounter: Payer: BC Managed Care – HMO | Admitting: *Deleted

## 2011-12-20 ENCOUNTER — Ambulatory Visit (INDEPENDENT_AMBULATORY_CARE_PROVIDER_SITE_OTHER): Payer: BC Managed Care – PPO | Admitting: Internal Medicine

## 2011-12-20 ENCOUNTER — Encounter: Payer: Self-pay | Admitting: Internal Medicine

## 2011-12-20 VITALS — BP 118/64 | HR 59 | Temp 97.8°F | Resp 16 | Ht 71.0 in | Wt 206.4 lb

## 2011-12-20 DIAGNOSIS — Z136 Encounter for screening for cardiovascular disorders: Secondary | ICD-10-CM

## 2011-12-20 DIAGNOSIS — Z0000 Encounter for general adult medical examination without abnormal findings: Secondary | ICD-10-CM

## 2011-12-20 LAB — LIPID PANEL
HDL: 34.8 mg/dL — ABNORMAL LOW (ref >39.00)
LDL Cholesterol: 113 mg/dL — ABNORMAL HIGH (ref 0–99)
Total CHOL/HDL Ratio: 5
VLDL: 31.4 mg/dL (ref 0.0–40.0)

## 2011-12-20 LAB — TSH: TSH: 1.56 u[IU]/mL (ref 0.35–5.50)

## 2011-12-20 LAB — HEPATIC FUNCTION PANEL
Alkaline Phosphatase: 62 U/L (ref 39–117)
Bilirubin, Direct: 0 mg/dL (ref 0.0–0.3)
Total Protein: 7 g/dL (ref 6.0–8.3)

## 2011-12-20 LAB — MICROALBUMIN / CREATININE URINE RATIO
Creatinine,U: 175.2 mg/dL
Microalb, Ur: 1.2 mg/dL (ref 0.0–1.9)

## 2011-12-20 LAB — CBC WITH DIFFERENTIAL/PLATELET
Basophils Absolute: 0.1 10*3/uL (ref 0.0–0.1)
Hemoglobin: 16.2 g/dL (ref 13.0–17.0)
Lymphocytes Relative: 23.1 % (ref 12.0–46.0)
Monocytes Relative: 6.5 % (ref 3.0–12.0)
Neutro Abs: 6.4 10*3/uL (ref 1.4–7.7)
RBC: 5.52 Mil/uL (ref 4.22–5.81)
RDW: 14.3 % (ref 11.5–14.6)

## 2011-12-20 LAB — BASIC METABOLIC PANEL
Calcium: 9.2 mg/dL (ref 8.4–10.5)
GFR: 63.1 mL/min (ref >60.00)
Glucose, Bld: 104 mg/dL — ABNORMAL HIGH (ref 70–99)
Sodium: 140 mEq/L (ref 135–145)

## 2011-12-20 LAB — HEMOGLOBIN A1C: Hgb A1c MFr Bld: 6.3 % (ref 4.6–6.5)

## 2011-12-20 MED ORDER — METFORMIN HCL 500 MG PO TABS: ORAL_TABLET | ORAL | Status: DC

## 2011-12-20 MED ORDER — FLUTICASONE-SALMETEROL 100-50 MCG/DOSE IN AEPB: 1.0000 | INHALATION_SPRAY | Freq: Two times a day (BID) | RESPIRATORY_TRACT | Status: DC

## 2011-12-20 NOTE — Patient Instructions (Addendum)
Preventive Health Care: Exercise at least 30-45 minutes a day,  3-4 days a week.  Eat a low-fat diet with lots of fruits and vegetables, up to 7-9 servings per day. Avoid obesity; your goal is waist measurement < 40 inches.Consume less than 40 grams of sugar per day from foods & drinks with High Fructose Corn Sugar as # 1,2,3 or # 4 on label. Eye Doctor - have an eye exam @ least annually.                                                          Please try to go on My Chart within the next 24 hours to allow me to release the results directly to you.  

## 2011-12-20 NOTE — Progress Notes (Signed)
  Subjective:    Patient ID: Shane Adams, male    DOB: 1956-06-24, 56 y.o.   MRN: [id number]  HPI   Shane Adams  is here for a physical; he denies acute issues.      Review of Systems HYPERTENSION: Disease Monitoring: Blood pressure range-not monitored  Chest pain, palpitations-no      Dyspnea- only with running Medications: Compliance- yes  Lightheadedness,Syncope- no    Edema-no  DIABETES: Disease Monitoring: Blood Sugar ranges-not monitored  Polyuria/phagia/dipsia- no       Visual problems- no Medications: Compliance- yes Hypoglycemic symptoms- no  HYPERLIPIDEMIA: Disease Monitoring: See symptoms for Hypertension Medications: Compliance- Crestor stopped by him, "no reason"  Abd pain, bowel changes- no   Muscle aches- no  He describes some mood change with being quick to anger since his been unemployed recently.         Objective:   Physical Exam Gen.:  well-nourished in appearance. Alert, appropriate and cooperative throughout exam. Head: Normocephalic without obvious abnormalities Eyes: No corneal or conjunctival inflammation noted. Pupils equal round reactive to light and accommodation. Fundal exam is benign without hemorrhages, exudate, papilledema. Extraocular motion intact. Vision grossly normal. Ears: External  ear exam reveals no significant lesions or deformities. Canals clear .TMs normal. Hearing is minimally decreased on L  Nose: External nasal exam reveals no deformity or inflammation. Nasal mucosa are pink and moist. No lesions or exudates noted.   Mouth: Oral mucosa and oropharynx reveal no lesions or exudates. Teeth in poor repair; severe caries LU tooth. Neck: No deformities, masses, or tenderness noted. Range of motion & Thyroid normal Lungs: Normal respiratory effort; chest expands symmetrically. Lungs are clear to auscultation without rales, wheezes, or increased work of breathing. Decreased BS Heart: Normal rate and rhythm. Normal S1 and S2.  No gallop, click, or rub.S 4 w/o  Murmur. Distant heart sounds Abdomen: Bowel sounds normal; abdomen soft and nontender. No masses, organomegaly . Ventral  hernia noted. Genitalia/ DRE: Genitalia normal.Prostate is normal without enlargement, asymmetry, nodularity, or induration.  Note: His wife has requested a PSA. Standard of care discussed              Musculoskeletal/extremities: No deformity or scoliosis noted of  the thoracic or lumbar spine; but there is some asymmetry of the posterior thoracic musculature suggesting occult scoliosis.  No clubbing, cyanosis, edema, or deformity noted. Range of motion  normal .Tone & strength  normal.Joints normal. Nail health  good. Vascular: Carotid, radial artery, dorsalis pedis and  posterior tibial pulses are full and equal. No bruits present. Neurologic: Alert and oriented x3. Deep tendon reflexes symmetrical and normal.          Skin: Intact without suspicious lesions or rashes. Lymph: No cervical, axillary, or inguinal lymphadenopathy present. Psych: Mood and affect are normal. Normally interactive                                                                                         Assessment & Plan:  #1 comprehensive physical exam; no acute findings #2 see Problem List with Assessments & Recommendations Plan: see Orders

## 2011-12-25 ENCOUNTER — Encounter: Payer: Self-pay | Admitting: *Deleted

## 2012-02-07 ENCOUNTER — Encounter: Payer: BC Managed Care – HMO | Admitting: Internal Medicine

## 2012-08-08 ENCOUNTER — Other Ambulatory Visit: Payer: Self-pay | Admitting: Internal Medicine

## 2012-09-10 ENCOUNTER — Encounter: Payer: BC Managed Care – PPO | Admitting: Physical Therapy

## 2012-11-12 ENCOUNTER — Other Ambulatory Visit: Payer: Self-pay | Admitting: Internal Medicine

## 2013-01-07 ENCOUNTER — Ambulatory Visit (INDEPENDENT_AMBULATORY_CARE_PROVIDER_SITE_OTHER): Payer: BC Managed Care – PPO | Admitting: Internal Medicine

## 2013-01-07 ENCOUNTER — Encounter: Payer: Self-pay | Admitting: Internal Medicine

## 2013-01-07 VITALS — BP 118/70 | HR 54 | Temp 98.0°F | Resp 12 | Ht 72.0 in | Wt 191.0 lb

## 2013-01-07 DIAGNOSIS — F39 Unspecified mood [affective] disorder: Secondary | ICD-10-CM

## 2013-01-07 DIAGNOSIS — E119 Type 2 diabetes mellitus without complications: Secondary | ICD-10-CM

## 2013-01-07 DIAGNOSIS — G4733 Obstructive sleep apnea (adult) (pediatric): Secondary | ICD-10-CM | POA: Insufficient documentation

## 2013-01-07 DIAGNOSIS — E785 Hyperlipidemia, unspecified: Secondary | ICD-10-CM

## 2013-01-07 DIAGNOSIS — F17200 Nicotine dependence, unspecified, uncomplicated: Secondary | ICD-10-CM

## 2013-01-07 DIAGNOSIS — Z0000 Encounter for general adult medical examination without abnormal findings: Secondary | ICD-10-CM

## 2013-01-07 DIAGNOSIS — Z23 Encounter for immunization: Secondary | ICD-10-CM

## 2013-01-07 DIAGNOSIS — R4586 Emotional lability: Secondary | ICD-10-CM

## 2013-01-07 LAB — CBC WITH DIFFERENTIAL/PLATELET
Basophils Absolute: 0 10*3/uL (ref 0.0–0.1)
Basophils Relative: 0.3 % (ref 0.0–3.0)
Eosinophils Absolute: 0.3 10*3/uL (ref 0.0–0.7)
HCT: 48 % (ref 39.0–52.0)
Hemoglobin: 15.9 g/dL (ref 13.0–17.0)
Lymphocytes Relative: 21.4 % (ref 12.0–46.0)
Lymphs Abs: 2.4 10*3/uL (ref 0.7–4.0)
MCHC: 33.1 g/dL (ref 30.0–36.0)
Neutro Abs: 8.2 10*3/uL — ABNORMAL HIGH (ref 1.4–7.7)
RBC: 5.34 Mil/uL (ref 4.22–5.81)
RDW: 15.4 % — ABNORMAL HIGH (ref 11.5–14.6)

## 2013-01-07 LAB — BASIC METABOLIC PANEL
CO2: 24 mEq/L (ref 19–32)
Calcium: 9.9 mg/dL (ref 8.4–10.5)
Chloride: 108 mEq/L (ref 96–112)
Glucose, Bld: 113 mg/dL — ABNORMAL HIGH (ref 70–99)
Potassium: 4.8 mEq/L (ref 3.5–5.1)
Sodium: 142 mEq/L (ref 135–145)

## 2013-01-07 LAB — TSH: TSH: 1.56 u[IU]/mL (ref 0.35–5.50)

## 2013-01-07 LAB — HEPATIC FUNCTION PANEL
ALT: 12 U/L (ref 0–53)
Albumin: 4.2 g/dL (ref 3.5–5.2)
Alkaline Phosphatase: 60 U/L (ref 39–117)
Bilirubin, Direct: 0.2 mg/dL (ref 0.0–0.3)
Total Protein: 7.1 g/dL (ref 6.0–8.3)

## 2013-01-07 LAB — MICROALBUMIN / CREATININE URINE RATIO
Microalb Creat Ratio: 0.7 mg/g (ref 0.0–30.0)
Microalb, Ur: 1.1 mg/dL (ref 0.0–1.9)

## 2013-01-07 LAB — LIPID PANEL
Total CHOL/HDL Ratio: 6
Triglycerides: 167 mg/dL — ABNORMAL HIGH (ref 0.0–149.0)

## 2013-01-07 MED ORDER — METFORMIN HCL 500 MG PO TABS: ORAL_TABLET | ORAL | Status: DC

## 2013-01-07 MED ORDER — LISINOPRIL 10 MG PO TABS: ORAL_TABLET | ORAL | Status: DC

## 2013-01-07 MED ORDER — BUPROPION HCL ER (SR) 150 MG PO TB12: ORAL_TABLET | ORAL | Status: DC

## 2013-01-07 MED ORDER — FLUTICASONE-SALMETEROL 100-50 MCG/DOSE IN AEPB: 1.0000 | INHALATION_SPRAY | Freq: Two times a day (BID) | RESPIRATORY_TRACT | Status: DC

## 2013-01-07 NOTE — Patient Instructions (Addendum)
Take the EKG to any emergency room or preop visits. There are nonspecific changes; as long as there is no new change these are not clinically significant . If the old EKG is not available for comparison; it may result in unnecessary hospitalization for observation with significant unnecessary expense. Repeat the isometric exercises discussed 4- 5 times prior to standing if you've been seated for a period of time.  If you activate the  My Chart system; lab & Xray results will be released directly  to you as soon as I review & address these through the computer. If you choose not to sign up for My Chart within 36 hours of labs being drawn; results will be reviewed & interpretation added before being copied & mailed, causing a delay in getting the results to you.If you do not receive that report within 7-10 days ,please call. Additionally you can use this system to gain direct  access to your records  if  out of town or @ an office of a  physician who is not in  the My Chart network.  This improves continuity of care & places you in control of your medical record.

## 2013-01-07 NOTE — Progress Notes (Signed)
  Subjective:    Patient ID: IHAN PAT, male    DOB: 02-Jun-1956, 57 y.o.   MRN: [id number]  HPI  He is here for a physical;acute issues include mood swings, especially anger & impatience.     Review of Systems HYPERTENSION: Disease Monitoring: Blood pressure range-not monitored  Chest pain, palpitations- no      Dyspnea- improved with weight loss & increased exercise Medications: Compliance-yes  Lightheadedness,Syncope- occasional positional lightheadedness    Edema- no  DIABETES: Disease Monitoring: Blood Sugar ranges-no monitor  Polyuria/phagia/dipsia- no     Visual problems- no; Ophth exam needed Medications: Compliance- no  Hypoglycemic symptoms- no  HYPERLIPIDEMIA: Disease Monitoring: See symptoms for Hypertension Medications: Compliance- no statin ; erroneous hx of Zocor allergy( Tricor caused hives)   Abd pain, bowel changes-no  Muscle aches- no          Objective:   Physical Exam Gen.:  well-nourished in appearance. Alert, appropriate and cooperative throughout exam. Head: Normocephalic without obvious abnormalities; moustache ; male pattern alopecia  Eyes: No corneal or conjunctival inflammation noted. Pupils equal round reactive to light and accommodation.  Extraocular motion intact. Vision grossly normal without lenses Ears: External  ear exam reveals no significant lesions or deformities. Some wax on R. L TM normal. Hearing is grossly normal bilaterally. Nose: External nasal exam reveals no deformity or inflammation. Nasal mucosa are pink and moist. No lesions or exudates noted.  Mouth: Oral mucosa and oropharynx reveal no lesions or exudates. Teeth in fair-poor repair. Neck: No deformities, masses, or tenderness noted. Range of motion & Thyroid normal. Lungs: Normal respiratory effort; chest expands symmetrically. Lungs are clear to auscultation without rales, wheezes, or increased work of breathing. Heart: Slow rate and regular rhythm. Normal S1  and S2. No gallop, click, or rub. No murmur. Abdomen: Bowel sounds normal; abdomen soft and nontender. No masses, organomegaly or hernias noted. Genitalia: Genitalia normal except for left varices.Hemorrhoidal tags present. Prostate is normal without enlargement, asymmetry, nodularity, or induration.                           Musculoskeletal/extremities: There is slight asymmetry of the posterior thoracic musculature suggesting occult scoliosis. No clubbing, cyanosis, edema, or significant extremity  deformity noted. Range of motion normal .Tone & strength  Normal. Joints normal. Nail health good; post traumatic hyperpigmentation R great nail Able to lie down & sit up w/o help. Negative SLR bilaterally Vascular: Carotid, radial artery, dorsalis pedis and  posterior tibial pulses are full and equal. No bruits present. Neurologic: Alert and oriented x3. Deep tendon reflexes symmetrical and normal.  Light touch normal over feet.       Skin: Intact without suspicious lesions or rashes. Lymph: No cervical, axillary, or inguinal lymphadenopathy present. Psych: Mood and affect are normal. Normally interactive                                                                                        Assessment & Plan:  #1 comprehensive physical exam; no acute findings  Plan: see Orders  & Recommendations

## 2013-01-13 ENCOUNTER — Encounter: Payer: Self-pay | Admitting: *Deleted

## 2013-07-09 ENCOUNTER — Other Ambulatory Visit: Payer: Self-pay | Admitting: Internal Medicine

## 2013-07-09 NOTE — Telephone Encounter (Signed)
Metformin refilled per protocol. JG//CMA 

## 2013-09-18 ENCOUNTER — Other Ambulatory Visit: Payer: Self-pay | Admitting: Internal Medicine

## 2014-01-08 ENCOUNTER — Other Ambulatory Visit (INDEPENDENT_AMBULATORY_CARE_PROVIDER_SITE_OTHER): Payer: BC Managed Care – PPO

## 2014-01-08 ENCOUNTER — Ambulatory Visit (INDEPENDENT_AMBULATORY_CARE_PROVIDER_SITE_OTHER)
Admission: RE | Admit: 2014-01-08 | Discharge: 2014-01-08 | Disposition: A | Discharge status: Home / Self Care | Payer: BC Managed Care – PPO | Source: Ambulatory Visit | Attending: Internal Medicine | Admitting: Internal Medicine

## 2014-01-08 ENCOUNTER — Encounter: Payer: Self-pay | Admitting: Internal Medicine

## 2014-01-08 ENCOUNTER — Ambulatory Visit (INDEPENDENT_AMBULATORY_CARE_PROVIDER_SITE_OTHER): Payer: BC Managed Care – PPO | Admitting: Internal Medicine

## 2014-01-08 VITALS — BP 120/66 | HR 51 | Temp 97.9°F | Ht 72.0 in | Wt 185.0 lb

## 2014-01-08 DIAGNOSIS — Z0000 Encounter for general adult medical examination without abnormal findings: Secondary | ICD-10-CM

## 2014-01-08 DIAGNOSIS — Z23 Encounter for immunization: Secondary | ICD-10-CM

## 2014-01-08 DIAGNOSIS — E119 Type 2 diabetes mellitus without complications: Secondary | ICD-10-CM

## 2014-01-08 DIAGNOSIS — F17200 Nicotine dependence, unspecified, uncomplicated: Secondary | ICD-10-CM

## 2014-01-08 DIAGNOSIS — I10 Essential (primary) hypertension: Secondary | ICD-10-CM

## 2014-01-08 DIAGNOSIS — E782 Mixed hyperlipidemia: Secondary | ICD-10-CM

## 2014-01-08 LAB — CBC WITH DIFFERENTIAL/PLATELET
BASOS ABS: 0.1 10*3/uL (ref 0.0–0.1)
Basophils Relative: 0.8 % (ref 0.0–3.0)
Eosinophils Absolute: 0.3 10*3/uL (ref 0.0–0.7)
Eosinophils Relative: 2.9 % (ref 0.0–5.0)
HCT: 45.8 % (ref 39.0–52.0)
HEMOGLOBIN: 15.4 g/dL (ref 13.0–17.0)
LYMPHS PCT: 19 % (ref 12.0–46.0)
Lymphs Abs: 2.1 10*3/uL (ref 0.7–4.0)
MCHC: 33.7 g/dL (ref 30.0–36.0)
MCV: 88.4 fl (ref 78.0–100.0)
Monocytes Absolute: 0.7 10*3/uL (ref 0.1–1.0)
Monocytes Relative: 6.4 % (ref 3.0–12.0)
NEUTROS ABS: 8 10*3/uL — AB (ref 1.4–7.7)
Neutrophils Relative %: 70.9 % (ref 43.0–77.0)
Platelets: 603 10*3/uL — ABNORMAL HIGH (ref 150.0–400.0)
RBC: 5.18 Mil/uL (ref 4.22–5.81)
RDW: 15.6 % — AB (ref 11.5–15.5)
WBC: 11.3 10*3/uL — ABNORMAL HIGH (ref 4.0–10.5)

## 2014-01-08 LAB — HEMOGLOBIN A1C: Hgb A1c MFr Bld: 6.1 % (ref 4.6–6.5)

## 2014-01-08 LAB — BASIC METABOLIC PANEL
BUN: 16 mg/dL (ref 6–23)
CALCIUM: 9.5 mg/dL (ref 8.4–10.5)
CO2: 29 meq/L (ref 19–32)
CREATININE: 1.2 mg/dL (ref 0.4–1.5)
Chloride: 107 mEq/L (ref 96–112)
GFR: 68.3 mL/min (ref >60.00)
GLUCOSE: 114 mg/dL — AB (ref 70–99)
Potassium: 5.3 mEq/L — ABNORMAL HIGH (ref 3.5–5.1)
SODIUM: 140 meq/L (ref 135–145)

## 2014-01-08 LAB — LIPID PANEL
CHOLESTEROL: 158 mg/dL (ref 0–200)
HDL: 33.7 mg/dL — AB (ref >39.00)
LDL Cholesterol: 108 mg/dL — ABNORMAL HIGH (ref 0–99)
NonHDL: 124.3
Total CHOL/HDL Ratio: 5
Triglycerides: 84 mg/dL (ref 0.0–149.0)
VLDL: 16.8 mg/dL (ref 0.0–40.0)

## 2014-01-08 LAB — HEPATIC FUNCTION PANEL
ALBUMIN: 3.9 g/dL (ref 3.5–5.2)
ALT: 16 U/L (ref 0–53)
AST: 18 U/L (ref 0–37)
Alkaline Phosphatase: 56 U/L (ref 39–117)
Bilirubin, Direct: 0.2 mg/dL (ref 0.0–0.3)
Total Bilirubin: 1.1 mg/dL (ref 0.2–1.2)
Total Protein: 6.6 g/dL (ref 6.0–8.3)

## 2014-01-08 LAB — MICROALBUMIN / CREATININE URINE RATIO
Creatinine,U: 202.8 mg/dL
MICROALB/CREAT RATIO: 0.7 mg/g (ref 0.0–30.0)
Microalb, Ur: 1.4 mg/dL (ref 0.0–1.9)

## 2014-01-08 LAB — PSA: PSA: 1.06 ng/mL (ref 0.10–4.00)

## 2014-01-08 LAB — TSH: TSH: 1.08 u[IU]/mL (ref 0.35–4.50)

## 2014-01-08 MED ORDER — LORAZEPAM 0.5 MG PO TABS: ORAL_TABLET | ORAL | Status: DC

## 2014-01-08 NOTE — Patient Instructions (Signed)
Your next office appointment will be determined based upon review of your pending labs & x-rays. Those instructions will be transmitted to you through My Chart . 

## 2014-01-08 NOTE — Progress Notes (Signed)
Pre visit review using our clinic review tool, if applicable. No additional management support is needed unless otherwise documented below in the visit note. 

## 2014-01-08 NOTE — Progress Notes (Signed)
Subjective:    Patient ID: Shane Adams, male    DOB: 09/06/1955, 58 y.o.   MRN: [id number]  HPI  He  is here for a physical;acute issues denied.   He follows no specific nutritional program. Despite this he has lost 40 pounds by decreasing portions and with increased activity on his job. He is physically active at his job at least 3 hours a day.  He does not monitor his blood pressure or his glucoses.  3-4 weeks ago his metformin was decreased to one half twice a day as he felt "weak". Otherwise he has been compliant with his medications.  His wife states he is very irritable. The more he smokes, the more irritable he becomes. Wellbutrin had been prescribed, but he did not like the way this made him feel and stopped it. He had been prescribed other agents in the past which she's also not continued.     Review of Systems   Denied are chest pain, palpitations, edema, claudication, lightheadedness, or syncope. He has no blurred vision, double vision, loss of vision  He does have some numbness in his hands but there is no burning or tingling in the feet.  He has no nonhealing skin lesions  He has no abdominal pain or bowel changes  Myalgias are denied.  Does not have a memory loss, but his wife states he does repeat himself often.     Objective:   Physical Exam   Significant or distinguishing  findings on physical exam are documented first.  Below that are other systems examined & findings.  Pattern alopecia is present  Has wax in right otic canal.  He has multiple missing teeth.  Thyroid is not palpable as a substernal.  Breath sounds are generally decreased.  Ventral hernia is present  Dorsalis pedis pulses are decreased.  Hemorrhoidal to present.  The prostate is not enlarged and there is no nodularity. There is slight asymmetry with the right lobe greater than the left.       Gen.: Adequately nourished in appearance. Alert, appropriate and cooperative  throughout exam.  Head: Normocephalic without obvious abnormalities. Eyes: No corneal or conjunctival inflammation noted. Pupils equal round reactive to light and accommodation. Extraocular motion intact.  Ears: External  ear exam reveals no significant lesions or deformities.  Hearing is grossly normal bilaterally. Nose: External nasal exam reveals no deformity or inflammation. Nasal mucosa are pink and moist. No lesions or exudates noted.   Mouth: Oral mucosa and oropharynx reveal no lesions or exudates.  Neck: No deformities, masses, or tenderness noted. Range of motion good. Lungs: Normal respiratory effort; chest expands symmetrically. Lungs are clear to auscultation without rales, wheezes, or increased work of breathing. Heart: Normal rate and rhythm. Normal S1 and S2. No gallop, click, or rub. No murmur. Abdomen: Bowel sounds normal; abdomen soft and nontender. No masses, organomegaly or hernias noted. Genitalia: Genitalia normal except for left varices. Musculoskeletal/extremities: No deformity or scoliosis noted of  the thoracic or lumbar spine.   No clubbing, cyanosis, edema, or significant extremity  deformity noted. Range of motion normal .Tone & strength normal. Hand joints normal  Fingernail / toenail health good. Able to lie down & sit up w/o help. Negative SLR bilaterally Vascular: Carotid, radial artery, dorsalis pedis and  posterior tibial pulses are equal. No bruits present. Neurologic: Alert and oriented x3. Deep tendon reflexes symmetrical and normal.  Gait normal .       Skin: Intact without suspicious lesions  or rashes. Lymph: No cervical, axillary, or inguinal lymphadenopathy present. Psych: Mood and affect are normal. Normally interactive                                                                                        Assessment & Plan:  #1 comprehensive physical exam; no acute findings  Plan: see Orders  & Recommendations

## 2014-01-09 ENCOUNTER — Other Ambulatory Visit: Payer: Self-pay | Admitting: Internal Medicine

## 2014-01-09 DIAGNOSIS — R9389 Abnormal findings on diagnostic imaging of other specified body structures: Secondary | ICD-10-CM

## 2014-01-09 DIAGNOSIS — R7303 Prediabetes: Secondary | ICD-10-CM

## 2014-01-09 DIAGNOSIS — F17200 Nicotine dependence, unspecified, uncomplicated: Secondary | ICD-10-CM

## 2014-01-14 ENCOUNTER — Encounter: Payer: Self-pay | Admitting: Internal Medicine

## 2014-01-15 ENCOUNTER — Other Ambulatory Visit: Payer: Self-pay | Admitting: Internal Medicine

## 2014-01-15 MED ORDER — SERTRALINE HCL 25 MG PO TABS: 25.0000 mg | ORAL_TABLET | Freq: Every day | ORAL | Status: DC

## 2014-01-16 NOTE — Telephone Encounter (Signed)
Phone call from patient spouse regarding the additional testing needed. I left her a voicemail to call back. Mychart msg from Dr Linna Darner states the reasoning for additional testing.

## 2014-01-20 ENCOUNTER — Ambulatory Visit (INDEPENDENT_AMBULATORY_CARE_PROVIDER_SITE_OTHER)
Admission: RE | Admit: 2014-01-20 | Discharge: 2014-01-20 | Disposition: A | Discharge status: Home / Self Care | Payer: BC Managed Care – PPO | Source: Ambulatory Visit | Attending: Internal Medicine | Admitting: Internal Medicine

## 2014-01-20 DIAGNOSIS — R9389 Abnormal findings on diagnostic imaging of other specified body structures: Secondary | ICD-10-CM

## 2014-01-20 DIAGNOSIS — F17200 Nicotine dependence, unspecified, uncomplicated: Secondary | ICD-10-CM

## 2014-02-03 ENCOUNTER — Other Ambulatory Visit: Payer: Self-pay | Admitting: Internal Medicine

## 2014-02-03 ENCOUNTER — Encounter: Payer: Self-pay | Admitting: Internal Medicine

## 2014-02-03 MED ORDER — METFORMIN HCL 500 MG PO TABS: ORAL_TABLET | ORAL | Status: DC

## 2014-02-03 MED ORDER — LISINOPRIL 10 MG PO TABS: ORAL_TABLET | ORAL | Status: DC

## 2014-02-03 MED ORDER — LORAZEPAM 0.5 MG PO TABS: ORAL_TABLET | ORAL | Status: DC

## 2014-02-04 ENCOUNTER — Telehealth: Payer: Self-pay

## 2014-02-04 MED ORDER — METFORMIN HCL 500 MG PO TABS: ORAL_TABLET | ORAL | Status: DC

## 2014-02-04 MED ORDER — FLUTICASONE-SALMETEROL 100-50 MCG/DOSE IN AEPB: 1.0000 | INHALATION_SPRAY | Freq: Two times a day (BID) | RESPIRATORY_TRACT | Status: DC

## 2014-02-04 MED ORDER — LISINOPRIL 10 MG PO TABS: ORAL_TABLET | ORAL | Status: DC

## 2014-02-04 NOTE — Telephone Encounter (Signed)
Message copied by Shelly Coss on Wed Feb 04, 2014  9:23 AM ------      Message from: Hendricks Limes      Created: Wed Feb 04, 2014  9:20 AM       Please see My Chart message; please renew meds X 6 mos ( except Lorazepam only #30). Metformin is 500 mg 1/2 qd.      I thought I  Sent these to [facility] as per chart but they use different pharmacy. Thanks, Hopp ------

## 2014-02-13 ENCOUNTER — Encounter: Payer: Self-pay | Admitting: Internal Medicine

## 2014-03-10 ENCOUNTER — Encounter: Payer: Self-pay | Admitting: Internal Medicine

## 2014-03-10 ENCOUNTER — Other Ambulatory Visit: Payer: Self-pay | Admitting: Internal Medicine

## 2014-03-10 NOTE — Telephone Encounter (Signed)
Prescription has been electronically sent to [facility] in [place]

## 2014-03-10 NOTE — Telephone Encounter (Signed)
OK # [phone] 

## 2014-04-21 ENCOUNTER — Telehealth: Payer: Self-pay | Admitting: Internal Medicine

## 2014-04-21 NOTE — Telephone Encounter (Signed)
I would need to see him after 6 months; refill X 3 mos

## 2014-04-21 NOTE — Telephone Encounter (Signed)
Pt was wondering if Dr. Linna Darner want him to follow up to get Sertraline refill, he is doing very good with this medication. Please advise. Call [employer]

## 2014-04-22 MED ORDER — SERTRALINE HCL 25 MG PO TABS: ORAL_TABLET | ORAL | Status: DC

## 2014-04-22 NOTE — Telephone Encounter (Signed)
Notified Jeenie via [employer]. Sertraline has been sent to [facility] in [place]

## 2014-06-08 ENCOUNTER — Ambulatory Visit (INDEPENDENT_AMBULATORY_CARE_PROVIDER_SITE_OTHER): Payer: BC Managed Care – PPO

## 2014-06-08 ENCOUNTER — Ambulatory Visit (INDEPENDENT_AMBULATORY_CARE_PROVIDER_SITE_OTHER): Payer: BC Managed Care – PPO | Admitting: Internal Medicine

## 2014-06-08 ENCOUNTER — Other Ambulatory Visit (INDEPENDENT_AMBULATORY_CARE_PROVIDER_SITE_OTHER): Payer: BC Managed Care – PPO

## 2014-06-08 ENCOUNTER — Encounter: Payer: Self-pay | Admitting: Internal Medicine

## 2014-06-08 VITALS — BP 132/60 | HR 54 | Temp 98.2°F | Wt 188.2 lb

## 2014-06-08 DIAGNOSIS — F411 Generalized anxiety disorder: Secondary | ICD-10-CM

## 2014-06-08 DIAGNOSIS — R7309 Other abnormal glucose: Secondary | ICD-10-CM

## 2014-06-08 DIAGNOSIS — R7303 Prediabetes: Secondary | ICD-10-CM

## 2014-06-08 DIAGNOSIS — E782 Mixed hyperlipidemia: Secondary | ICD-10-CM

## 2014-06-08 DIAGNOSIS — Z720 Tobacco use: Secondary | ICD-10-CM

## 2014-06-08 DIAGNOSIS — Z23 Encounter for immunization: Secondary | ICD-10-CM

## 2014-06-08 DIAGNOSIS — J439 Emphysema, unspecified: Secondary | ICD-10-CM

## 2014-06-08 DIAGNOSIS — F17200 Nicotine dependence, unspecified, uncomplicated: Secondary | ICD-10-CM

## 2014-06-08 LAB — HEMOGLOBIN A1C: HEMOGLOBIN A1C: 6.2 % (ref 4.6–6.5)

## 2014-06-08 MED ORDER — SERTRALINE HCL 25 MG PO TABS: ORAL_TABLET | ORAL | Status: DC

## 2014-06-08 MED ORDER — METFORMIN HCL 500 MG PO TABS: ORAL_TABLET | ORAL | Status: DC

## 2014-06-08 NOTE — Progress Notes (Signed)
Pre visit review using our clinic review tool, if applicable. No additional management support is needed unless otherwise documented below in the visit note. 

## 2014-06-08 NOTE — Progress Notes (Signed)
   Subjective:    Patient ID: Shane Adams, male    DOB: May 04, 1956, 58 y.o.   MRN: [id number]  HPI   He is here in follow-up of dyslipidemia risk; his insurance company  had recommend consideration of a statin.  In July of this year his LDL was 108 and HDL 32.7.  The basis for the recommendation was that he is on metformin therapy  He is on extremely low doses of metformin prophylactically at the request of his wife who is a Marine scientist. His A1c in July was 6.1% to be prediabetic  Managed care also recommended a bronchodilator. He does continue to smoke but has decreased to a half pack per day. He is he has a new job and is not able to smoke @ new location. He uses Advair up to 3 times a week. He's not had to use a rescue inhaler.   He denies active cardiac symptoms at this time.  He feels of the sertraline is of great benefit as does his wife.  Review of Systems    Chest pain, palpitations, tachycardia, exertional dyspnea, paroxysmal nocturnal dyspnea, claudication or edema are absent.        Objective:   Physical Exam  Positive or pertinent findings include: He has a mustache; pattern alopecia is present He has isolated severe caries. Breath sounds are decreased; there is no increased work of breathing The first heart sound is accentuated ;he has an S4 .   General appearance :adequately nourished; in no distress. Eyes: No conjunctival inflammation or scleral icterus is present. Oral exam: Lips and gums are healthy appearing.There is no oropharyngeal erythema or exudate noted.  Heart:  Normal rate and regular rhythm. S2 normal without gallop, murmur, click, rub or other extra sounds   Lungs:Chest clear to auscultation; no wheezes, rhonchi,rales ,or rubs present. Abdomen: bowel sounds normal, soft and non-tender without masses, organomegaly or hernias noted.  No guarding or rebound.  Vascular : all pulses equal ; no bruits present. Skin:Warm & dry.  Intact without  suspicious lesions or rashes ; no jaundice or tenting Lymphatic: No lymphadenopathy is noted about the head, neck, axilla Oriented X 3; not anxious.           Assessment & Plan:  #1 prediabetes; prophylactic low-dose metformin as per his wife's request unless A1c continues to drop  #2 generalized anxiety state; significant response to sertraline  #3 COPD; stable. No rescue MDI needed  Plan: See orders and recommendations

## 2014-06-08 NOTE — Patient Instructions (Signed)
Your next office appointment will be determined based upon review of your pending labs . Those instructions will be transmitted to you through My Chart    Caries ( cavities) and plaque on the teeth can lead to significant local and systemic infections with threat to your health.Please have dental care completed as soon as possible.

## 2014-06-09 ENCOUNTER — Telehealth: Payer: Self-pay | Admitting: Internal Medicine

## 2014-06-09 NOTE — Telephone Encounter (Signed)
emmi emailed °

## 2014-06-22 ENCOUNTER — Encounter: Payer: Self-pay | Admitting: Internal Medicine

## 2014-12-19 ENCOUNTER — Other Ambulatory Visit: Payer: Self-pay | Admitting: Internal Medicine

## 2014-12-21 NOTE — Telephone Encounter (Signed)
OK X 3 mos 

## 2014-12-21 NOTE — Telephone Encounter (Signed)
Last office visit November 2015---please advise, thanks

## 2015-01-12 ENCOUNTER — Ambulatory Visit (INDEPENDENT_AMBULATORY_CARE_PROVIDER_SITE_OTHER): Payer: BC Managed Care – PPO | Admitting: Internal Medicine

## 2015-01-12 ENCOUNTER — Other Ambulatory Visit: Payer: Self-pay | Admitting: Emergency Medicine

## 2015-01-12 ENCOUNTER — Other Ambulatory Visit (INDEPENDENT_AMBULATORY_CARE_PROVIDER_SITE_OTHER): Payer: BC Managed Care – PPO

## 2015-01-12 ENCOUNTER — Encounter: Payer: Self-pay | Admitting: Internal Medicine

## 2015-01-12 VITALS — BP 120/78 | HR 72 | Temp 97.7°F | Resp 14 | Ht 72.0 in | Wt 189.0 lb

## 2015-01-12 DIAGNOSIS — Z0189 Encounter for other specified special examinations: Secondary | ICD-10-CM

## 2015-01-12 DIAGNOSIS — F411 Generalized anxiety disorder: Secondary | ICD-10-CM

## 2015-01-12 DIAGNOSIS — Z0000 Encounter for general adult medical examination without abnormal findings: Secondary | ICD-10-CM

## 2015-01-12 DIAGNOSIS — R7303 Prediabetes: Secondary | ICD-10-CM

## 2015-01-12 LAB — CBC WITH DIFFERENTIAL/PLATELET
BASOS ABS: 0.1 10*3/uL (ref 0.0–0.1)
BASOS PCT: 1 % (ref 0.0–3.0)
EOS ABS: 0.3 10*3/uL (ref 0.0–0.7)
Eosinophils Relative: 2.9 % (ref 0.0–5.0)
HCT: 47.8 % (ref 39.0–52.0)
HEMOGLOBIN: 15.9 g/dL (ref 13.0–17.0)
LYMPHS ABS: 2.3 10*3/uL (ref 0.7–4.0)
Lymphocytes Relative: 19.8 % (ref 12.0–46.0)
MCHC: 33.3 g/dL (ref 30.0–36.0)
MCV: 88.3 fl (ref 78.0–100.0)
Monocytes Absolute: 0.5 10*3/uL (ref 0.1–1.0)
Monocytes Relative: 4.4 % (ref 3.0–12.0)
Neutro Abs: 8.5 10*3/uL — ABNORMAL HIGH (ref 1.4–7.7)
Neutrophils Relative %: 71.9 % (ref 43.0–77.0)
Platelets: 572 10*3/uL — ABNORMAL HIGH (ref 150.0–400.0)
RBC: 5.41 Mil/uL (ref 4.22–5.81)
RDW: 15.6 % — ABNORMAL HIGH (ref 11.5–15.5)
WBC: 11.8 10*3/uL — ABNORMAL HIGH (ref 4.0–10.5)

## 2015-01-12 LAB — HEPATIC FUNCTION PANEL
ALT: 10 U/L (ref 0–53)
AST: 12 U/L (ref 0–37)
Albumin: 3.9 g/dL (ref 3.5–5.2)
Alkaline Phosphatase: 62 U/L (ref 39–117)
Bilirubin, Direct: 0.1 mg/dL (ref 0.0–0.3)
Total Bilirubin: 0.8 mg/dL (ref 0.2–1.2)
Total Protein: 6.5 g/dL (ref 6.0–8.3)

## 2015-01-12 LAB — BASIC METABOLIC PANEL
BUN: 21 mg/dL (ref 6–23)
CALCIUM: 9.5 mg/dL (ref 8.4–10.5)
CO2: 26 mEq/L (ref 19–32)
CREATININE: 1.21 mg/dL (ref 0.40–1.50)
Chloride: 106 mEq/L (ref 96–112)
GFR: 64.83 mL/min (ref >60.00)
Glucose, Bld: 110 mg/dL — ABNORMAL HIGH (ref 70–99)
Potassium: 5.1 mEq/L (ref 3.5–5.1)
Sodium: 140 mEq/L (ref 135–145)

## 2015-01-12 LAB — LIPID PANEL
CHOL/HDL RATIO: 5
Cholesterol: 157 mg/dL (ref 0–200)
HDL: 30.4 mg/dL — ABNORMAL LOW (ref >39.00)
LDL CALC: 101 mg/dL — AB (ref 0–99)
NONHDL: 126.6
TRIGLYCERIDES: 127 mg/dL (ref 0.0–149.0)
VLDL: 25.4 mg/dL (ref 0.0–40.0)

## 2015-01-12 LAB — TSH: TSH: 1.6 u[IU]/mL (ref 0.35–4.50)

## 2015-01-12 LAB — PSA: PSA: 0.83 ng/mL (ref 0.10–4.00)

## 2015-01-12 LAB — HEMOGLOBIN A1C: Hgb A1c MFr Bld: 5.9 % (ref 4.6–6.5)

## 2015-01-12 MED ORDER — FLUTICASONE-SALMETEROL 100-50 MCG/DOSE IN AEPB: 1.0000 | INHALATION_SPRAY | Freq: Two times a day (BID) | RESPIRATORY_TRACT | Status: DC

## 2015-01-12 MED ORDER — LISINOPRIL 10 MG PO TABS: ORAL_TABLET | ORAL | Status: DC

## 2015-01-12 MED ORDER — SERTRALINE HCL 25 MG PO TABS: ORAL_TABLET | ORAL | Status: DC

## 2015-01-12 MED ORDER — METFORMIN HCL 500 MG PO TABS: ORAL_TABLET | ORAL | Status: DC

## 2015-01-12 MED ORDER — LORAZEPAM 0.5 MG PO TABS: ORAL_TABLET | ORAL | Status: DC

## 2015-01-12 NOTE — Patient Instructions (Signed)
  Your next office appointment will be determined based upon review of your pending labs .  Those written interpretation of the lab results and instructions will be transmitted to you by My Chart  Critical results will be called.   Followup as needed for any active or acute issue. Please report any significant change in your symptoms.  Minimal Blood Pressure Goal= AVERAGE < 140/90;  Ideal is an AVERAGE < 135/85. This AVERAGE should be calculated from @ least 5-7 BP readings taken @ different times of day on different days of week. You should not respond to isolated BP readings , but rather the AVERAGE for that week .Please bring your  blood pressure cuff to office visits to verify that it is reliable.It  can also be checked against the blood pressure device at the pharmacy. Finger or wrist cuffs are not dependable; an arm cuff is.  Please think about quitting smoking. Review the risks we discussed. Please call 1-800-QUIT-NOW [phone]) for free smoking cessation counseling. Colonoscopy due 2017

## 2015-01-12 NOTE — Progress Notes (Signed)
Pre visit review using our clinic review tool, if applicable. No additional management support is needed unless otherwise documented below in the visit note. 

## 2015-01-12 NOTE — Progress Notes (Signed)
Subjective:    Patient ID: Shane Adams, male    DOB: July 12, 1955, 59 y.o.   MRN: [id number]  HPI He is here for a physical;acute issues denied.  He is not on a heart healthy diet; he does restrict salt. He is not checking his blood pressure or glucoses. He is very physically active on his job ;but he has no regular excise program.  He has been compliant with his medicines without adverse effects.  He had 3 tubular adenomas in 2012; he has no active GI symptoms. Colonoscopy is due 2017.  Review of Systems   Chest pain, palpitations, tachycardia, exertional dyspnea, paroxysmal nocturnal dyspnea, claudication or edema are absent. No unexplained weight loss, abdominal pain, significant dyspepsia, dysphagia, melena, rectal bleeding, or persistently small caliber stools. Dysuria, pyuria, hematuria, frequency, nocturia or polyuria are denied. Change in hair, skin, nails denied. No bowel changes of constipation or diarrhea. No intolerance to heat or cold. She No limb numbness, tingling, weakness. No nonhealing skin lesions.      Objective:   Physical Exam Gen.: Adequately nourished in appearance. Alert, appropriate and cooperative throughout exam. BMI:25.63 Head: Normocephalic without obvious abnormalities; pattern alopecia  Eyes: No corneal or conjunctival inflammation noted. Pupils equal round reactive to light and accommodation. Extraocular motion intact.  Ears: External  ear exam reveals no significant lesions or deformities. Some wax in the right otic canal . Hearing is normal  bilaterally. Nose: External nasal exam reveals no deformity or inflammation. Nasal mucosa are pink and moist. No lesions or exudates noted.   Mouth: Oral mucosa and oropharynx reveal no lesions or exudates. S/P  Uvulectomy.Teeth in poor repair with isolated caries to & below gumline. Neck: No deformities, masses, or tenderness noted. Range of motion and thyroid  normal  Lungs: Normal respiratory effort; chest  expands symmetrically. Lungs are clear to auscultation without rales, wheezes, or increased work of breathing.The breath sounds are decreased  Heart: rhythm is slow and slightly irregular with respiratory variation. No murmur, rub, or click present  Abdomen: Bowel sounds normal; abdomen soft and nontender. No masses, or organomegaly . Ventral hernia noted. Genitalia:  normal except bilateral varices. Prostate is not enlarged ; but the left lobe is small. No nodules present. Musculoskeletal/extremities: No deformity or scoliosis noted of  the thoracic or lumbar spine.  No clubbing, cyanosis, edema, or significant extremity  deformity noted.  Range of motion normal . Tone & strength normal. Hand joints normal.  Fingernail  health good. Crepitus of knees  Able to lie down & sit up w/o help.  Negative SLR bilaterally Vascular: Carotid, radial artery, dorsalis pedis and  posterior tibial pulses are full and equal. No bruits present. Neurologic: Alert and oriented x3. Deep tendon reflexes symmetrical and normal.  Gait normal       Skin: Intact without suspicious lesions or rashes. Lymph: No cervical, axillary, or inguinal lymphadenopathy present. Psych: Mood and affect are normal. Normally interactive                                                                                       Assessment & Plan:  #1 comprehensive physical exam; no  acute findings  Plan: see Orders  & Recommendations

## 2015-08-06 ENCOUNTER — Encounter: Payer: Self-pay | Admitting: Internal Medicine

## 2016-01-14 ENCOUNTER — Ambulatory Visit (INDEPENDENT_AMBULATORY_CARE_PROVIDER_SITE_OTHER): Payer: BC Managed Care – PPO | Admitting: Internal Medicine

## 2016-01-14 ENCOUNTER — Other Ambulatory Visit (INDEPENDENT_AMBULATORY_CARE_PROVIDER_SITE_OTHER): Payer: BC Managed Care – PPO

## 2016-01-14 ENCOUNTER — Encounter: Payer: Self-pay | Admitting: Internal Medicine

## 2016-01-14 VITALS — BP 130/86 | HR 68 | Temp 97.8°F | Resp 16 | Ht 72.0 in | Wt 190.0 lb

## 2016-01-14 DIAGNOSIS — E782 Mixed hyperlipidemia: Secondary | ICD-10-CM

## 2016-01-14 DIAGNOSIS — Z125 Encounter for screening for malignant neoplasm of prostate: Secondary | ICD-10-CM

## 2016-01-14 DIAGNOSIS — E119 Type 2 diabetes mellitus without complications: Secondary | ICD-10-CM | POA: Diagnosis not present

## 2016-01-14 DIAGNOSIS — F411 Generalized anxiety disorder: Secondary | ICD-10-CM

## 2016-01-14 DIAGNOSIS — Z23 Encounter for immunization: Secondary | ICD-10-CM | POA: Diagnosis not present

## 2016-01-14 DIAGNOSIS — I10 Essential (primary) hypertension: Secondary | ICD-10-CM

## 2016-01-14 DIAGNOSIS — Z0000 Encounter for general adult medical examination without abnormal findings: Secondary | ICD-10-CM

## 2016-01-14 DIAGNOSIS — N400 Enlarged prostate without lower urinary tract symptoms: Secondary | ICD-10-CM

## 2016-01-14 LAB — LIPID PANEL
Cholesterol: 183 mg/dL (ref 0–200)
HDL: 33.4 mg/dL — AB (ref >39.00)
LDL CALC: 126 mg/dL — AB (ref 0–99)
NONHDL: 149.93
Total CHOL/HDL Ratio: 5
Triglycerides: 120 mg/dL (ref 0.0–149.0)
VLDL: 24 mg/dL (ref 0.0–40.0)

## 2016-01-14 LAB — CBC WITH DIFFERENTIAL/PLATELET
BASOS PCT: 0.9 % (ref 0.0–3.0)
Basophils Absolute: 0.1 10*3/uL (ref 0.0–0.1)
EOS ABS: 0.5 10*3/uL (ref 0.0–0.7)
EOS PCT: 4 % (ref 0.0–5.0)
HCT: 47.5 % (ref 39.0–52.0)
Hemoglobin: 16 g/dL (ref 13.0–17.0)
LYMPHS ABS: 2.5 10*3/uL (ref 0.7–4.0)
Lymphocytes Relative: 20.1 % (ref 12.0–46.0)
MCHC: 33.6 g/dL (ref 30.0–36.0)
MCV: 85.7 fl (ref 78.0–100.0)
MONO ABS: 0.7 10*3/uL (ref 0.1–1.0)
Monocytes Relative: 5.5 % (ref 3.0–12.0)
NEUTROS ABS: 8.6 10*3/uL — AB (ref 1.4–7.7)
NEUTROS PCT: 69.5 % (ref 43.0–77.0)
PLATELETS: 636 10*3/uL — AB (ref 150.0–400.0)
RBC: 5.54 Mil/uL (ref 4.22–5.81)
RDW: 15.8 % — AB (ref 11.5–15.5)
WBC: 12.4 10*3/uL — ABNORMAL HIGH (ref 4.0–10.5)

## 2016-01-14 LAB — MICROALBUMIN / CREATININE URINE RATIO
Creatinine,U: 143.6 mg/dL
MICROALB/CREAT RATIO: 0.9 mg/g (ref 0.0–30.0)
Microalb, Ur: 1.3 mg/dL (ref 0.0–1.9)

## 2016-01-14 LAB — COMPREHENSIVE METABOLIC PANEL
ALBUMIN: 4.2 g/dL (ref 3.5–5.2)
ALT: 9 U/L (ref 0–53)
AST: 11 U/L (ref 0–37)
Alkaline Phosphatase: 74 U/L (ref 39–117)
BUN: 24 mg/dL — AB (ref 6–23)
CHLORIDE: 105 meq/L (ref 96–112)
CO2: 28 meq/L (ref 19–32)
Calcium: 9.2 mg/dL (ref 8.4–10.5)
Creatinine, Ser: 1.23 mg/dL (ref 0.40–1.50)
GFR: 63.41 mL/min (ref >60.00)
GLUCOSE: 115 mg/dL — AB (ref 70–99)
POTASSIUM: 4.2 meq/L (ref 3.5–5.1)
SODIUM: 140 meq/L (ref 135–145)
Total Bilirubin: 0.9 mg/dL (ref 0.2–1.2)
Total Protein: 6.7 g/dL (ref 6.0–8.3)

## 2016-01-14 LAB — HEMOGLOBIN A1C: HEMOGLOBIN A1C: 6 % (ref 4.6–6.5)

## 2016-01-14 LAB — TSH: TSH: 2.08 u[IU]/mL (ref 0.35–4.50)

## 2016-01-14 MED ORDER — METFORMIN HCL 500 MG PO TABS: ORAL_TABLET | ORAL | Status: DC

## 2016-01-14 MED ORDER — SERTRALINE HCL 50 MG PO TABS: 50.0000 mg | ORAL_TABLET | Freq: Every day | ORAL | Status: DC

## 2016-01-14 MED ORDER — LORAZEPAM 0.5 MG PO TABS: ORAL_TABLET | ORAL | Status: DC

## 2016-01-14 MED ORDER — LISINOPRIL 10 MG PO TABS: ORAL_TABLET | ORAL | Status: DC

## 2016-01-14 NOTE — Assessment & Plan Note (Signed)
Asymptomatic  Check PSA, which has been low-normal Deferred prostate exam given lack of symptoms and normal psa

## 2016-01-14 NOTE — Patient Instructions (Addendum)
Test(s) ordered today. Your results will be released to [place] (or called to you) after review, usually within 72hours after test completion. If any changes need to be made, you will be notified at that same time.  All other Health Maintenance issues reviewed.   All recommended immunizations and age-appropriate screenings are up-to-date or discussed.  Shingles vaccine administered today.   Medications reviewed and updated.  Changes include increasing the sertraline to 50 mg daily.  Your prescription(s) have been submitted to your pharmacy. Please take as directed and contact our office if you believe you are having problem(s) with the medication(s).  Please followup in one year.  Health Maintenance, Male A healthy lifestyle and preventative care can promote health and wellness.  Maintain regular health, dental, and eye exams.  Eat a healthy diet. Foods like vegetables, fruits, whole grains, low-fat dairy products, and lean protein foods contain the nutrients you need and are low in calories. Decrease your intake of foods high in solid fats, added sugars, and salt. Get information about a proper diet from your health care provider, if necessary.  Regular physical exercise is one of the most important things you can do for your health. Most adults should get at least 150 minutes of moderate-intensity exercise (any activity that increases your heart rate and causes you to sweat) each week. In addition, most adults need muscle-strengthening exercises on 2 or more days a week.   Maintain a healthy weight. The body mass index (BMI) is a screening tool to identify possible weight problems. It provides an estimate of body fat based on height and weight. Your health care provider can find your BMI and can help you achieve or maintain a healthy weight. For males 20 years and older:  A BMI below 18.5 is considered underweight.  A BMI of 18.5 to 24.9 is normal.  A BMI of 25 to 29.9 is considered  overweight.  A BMI of 30 and above is considered obese.  Maintain normal blood lipids and cholesterol by exercising and minimizing your intake of saturated fat. Eat a balanced diet with plenty of fruits and vegetables. Blood tests for lipids and cholesterol should begin at age 43 and be repeated every 5 years. If your lipid or cholesterol levels are high, you are over age 3, or you are at high risk for heart disease, you may need your cholesterol levels checked more frequently.Ongoing high lipid and cholesterol levels should be treated with medicines if diet and exercise are not working.  If you smoke, find out from your health care provider how to quit. If you do not use tobacco, do not start.  Lung cancer screening is recommended for adults aged 81-80 years who are at high risk for developing lung cancer because of a history of smoking. A yearly low-dose CT scan of the lungs is recommended for people who have at least a 30-pack-year history of smoking and are current smokers or have quit within the past 15 years. A pack year of smoking is smoking an average of 1 pack of cigarettes a day for 1 year (for example, a 30-pack-year history of smoking could mean smoking 1 pack a day for 30 years or 2 packs a day for 15 years). Yearly screening should continue until the smoker has stopped smoking for at least 15 years. Yearly screening should be stopped for people who develop a health problem that would prevent them from having lung cancer treatment.  If you choose to drink alcohol, do not have  more than 2 drinks per day. One drink is considered to be 12 oz (360 mL) of beer, 5 oz (150 mL) of wine, or 1.5 oz (45 mL) of liquor.  Avoid the use of street drugs. Do not share needles with anyone. Ask for help if you need support or instructions about stopping the use of drugs.  High blood pressure causes heart disease and increases the risk of stroke. High blood pressure is more likely to develop in:  People  who have blood pressure in the end of the normal range (100-139/85-89 mm Hg).  People who are overweight or obese.  People who are African American.  If you are 8-32 years of age, have your blood pressure checked every 3-5 years. If you are 33 years of age or older, have your blood pressure checked every year. You should have your blood pressure measured twice--once when you are at a hospital or clinic, and once when you are not at a hospital or clinic. Record the average of the two measurements. To check your blood pressure when you are not at a hospital or clinic, you can use:  An automated blood pressure machine at a pharmacy.  A home blood pressure monitor.  If you are 58-76 years old, ask your health care provider if you should take aspirin to prevent heart disease.  Diabetes screening involves taking a blood sample to check your fasting blood sugar level. This should be done once every 3 years after age 31 if you are at a normal weight and without risk factors for diabetes. Testing should be considered at a younger age or be carried out more frequently if you are overweight and have at least 1 risk factor for diabetes.  Colorectal cancer can be detected and often prevented. Most routine colorectal cancer screening begins at the age of 62 and continues through age 44. However, your health care provider may recommend screening at an earlier age if you have risk factors for colon cancer. On a yearly basis, your health care provider may provide home test kits to check for hidden blood in the stool. A small camera at the end of a tube may be used to directly examine the colon (sigmoidoscopy or colonoscopy) to detect the earliest forms of colorectal cancer. Talk to your health care provider about this at age 105 when routine screening begins. A direct exam of the colon should be repeated every 5-10 years through age 58, unless early forms of precancerous polyps or small growths are found.  People  who are at an increased risk for hepatitis B should be screened for this virus. You are considered at high risk for hepatitis B if:  You were born in a country where hepatitis B occurs often. Talk with your health care provider about which countries are considered high risk.  Your parents were born in a high-risk country and you have not received a shot to protect against hepatitis B (hepatitis B vaccine).  You have HIV or AIDS.  You use needles to inject street drugs.  You live with, or have sex with, someone who has hepatitis B.  You are a man who has sex with other men (MSM).  You get hemodialysis treatment.  You take certain medicines for conditions like cancer, organ transplantation, and autoimmune conditions.  Hepatitis C blood testing is recommended for all people born from [birth year] through 1967 and any individual with known risk factors for hepatitis C.  Healthy men should no longer receive prostate-specific antigen (  PSA) blood tests as part of routine cancer screening. Talk to your health care provider about prostate cancer screening.  Testicular cancer screening is not recommended for adolescents or adult males who have no symptoms. Screening includes self-exam, a health care provider exam, and other screening tests. Consult with your health care provider about any symptoms you have or any concerns you have about testicular cancer.  Practice safe sex. Use condoms and avoid high-risk sexual practices to reduce the spread of sexually transmitted infections (STIs).  You should be screened for STIs, including gonorrhea and chlamydia if:  You are sexually active and are younger than 24 years.  You are older than 24 years, and your health care provider tells you that you are at risk for this type of infection.  Your sexual activity has changed since you were last screened, and you are at an increased risk for chlamydia or gonorrhea. Ask your health care provider if you are at  risk.  If you are at risk of being infected with HIV, it is recommended that you take a prescription medicine daily to prevent HIV infection. This is called pre-exposure prophylaxis (PrEP). You are considered at risk if:  You are a man who has sex with other men (MSM).  You are a heterosexual man who is sexually active with multiple partners.  You take drugs by injection.  You are sexually active with a partner who has HIV.  Talk with your health care provider about whether you are at high risk of being infected with HIV. If you choose to begin PrEP, you should first be tested for HIV. You should then be tested every 3 months for as long as you are taking PrEP.  Use sunscreen. Apply sunscreen liberally and repeatedly throughout the day. You should seek shade when your shadow is shorter than you. Protect yourself by wearing long sleeves, pants, a wide-brimmed hat, and sunglasses year round whenever you are outdoors.  Tell your health care provider of new moles or changes in moles, especially if there is a change in shape or color. Also, tell your health care provider if a mole is larger than the size of a pencil eraser.  A one-time screening for abdominal aortic aneurysm (AAA) and surgical repair of large AAAs by ultrasound is recommended for men aged 24-75 years who are current or former smokers.  Stay current with your vaccines (immunizations).   This information is not intended to replace advice given to you by your health care provider. Make sure you discuss any questions you have with your health care provider.   Document Released: 12/23/2007 Document Revised: 07/17/2014 Document Reviewed: 11/21/2010 Elsevier Interactive Patient Education [employer].

## 2016-01-14 NOTE — Progress Notes (Signed)
Pre visit review using our clinic review tool, if applicable. No additional management support is needed unless otherwise documented below in the visit note. 

## 2016-01-14 NOTE — Assessment & Plan Note (Signed)
BP well controlled Current regimen effective and well tolerated Continue current medications at current doses cmp  

## 2016-01-14 NOTE — Assessment & Plan Note (Signed)
Anxiety not controlled - asked to increase sertraline Will increase to 50 mg daily Uses lorazepam as needed - uses infrequently

## 2016-01-14 NOTE — Addendum Note (Signed)
Addended by: Terence Lux B on: 01/14/2016 08:37 AM   Modules accepted: Orders

## 2016-01-14 NOTE — Progress Notes (Addendum)
Subjective:    Patient ID: Shane Adams, male    DOB: 1956-04-21, 60 y.o.   MRN: [id number]  HPI He is here to establish with a new pcp.   He is here for a physical exam.   He is taking all of his medication daily as prescribed.  He is still working and is active at work, but does not exercise.  His diet is pretty healthy.  He does not monitor his sugars or BP at home.   He denies changes in his health since he was here last.   His anxiety, nervousness is not as well controlled.  He takes the sertraline daily and wonders if that can be increased.  He takes the ativan on occasion only - maybe once every three weeks.    Medications and allergies reviewed with patient and updated if appropriate.  Patient Active Problem List   Diagnosis Date Noted  . Obstructive sleep apnea 01/07/2013  . SMOKER 10/01/2009  . NEPHROLITHIASIS, HX OF 10/01/2009  . Diabetes ([place]) 09/24/2008  . History of colonic polyps 09/24/2008  . ERECTILE DYSFUNCTION 01/24/2008  . CARPAL TUNNEL SYNDROME 01/24/2008  . Anxiety state 05/31/2007  . HYPERPLASIA PROSTATE UNS W/O UR OBST & OTH LUTS 05/31/2007  . HYPERLIPIDEMIA 05/09/2007  . Essential hypertension 05/09/2007  . 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 aspirin 325 MG EC tablet Take 325 mg by mouth daily.    . Fluticasone-Salmeterol (ADVAIR DISKUS) 100-50 MCG/DOSE AEPB Inhale 1 puff into the lungs every 12 (twelve) hours. 60 each 5  . lisinopril (PRINIVIL,ZESTRIL) 10 MG tablet TAKE ONE TABLET BY MOUTH ONE TIME DAILY 90 tablet 3  . LORazepam (ATIVAN) 0.5 MG tablet 1 q 12 hrs prn only, not as maintenance 60 tablet 5  . metFORMIN (GLUCOPHAGE) 500 MG tablet 1/2 qd with largest meal 45 tablet 3  . Multiple Vitamin (MULTIVITAMIN) tablet Take 1 tablet by mouth daily.    . sertraline (ZOLOFT) 25 MG tablet TAKE ONE TABLET BY MOUTH ONCE DAILY 90 tablet 3   No current  facility-administered medications on file prior to visit.    Past Medical History  Diagnosis Date  . Kidney calculi 1976    passed spontaneously  . COPD (chronic obstructive pulmonary disease)     RAD  component  . Hyperlipidemia   . Hypertension   . Diabetes mellitus   . Gilbert's syndrome     Past Surgical History  Procedure Laterality Date  . Uvulectomy  1984    for sleep apnea   . Tonsillectomy  1984  . Colonoscopy w/ polypectomy  2007 & 2012    Dr Henrene Pastor  . Cardiac catheterization  2004    mild CAD    Social History   Social History  . Marital Status: Married    Spouse Name: N/A  . Number of Children: N/A  . Years of Education: N/A   Social History Main Topics  . Smoking status: Current Every Day Smoker -- 1.00 packs/day    Types: Cigarettes  . Smokeless tobacco: Never Used     Comment: < 1 ppd ( smoked 1973-present , up to 2 ppd); see Problem List  . Alcohol Use: No  . Drug Use: No  . Sexual Activity: Not on file   Other Topics Concern  . Not on file   Social History Narrative    Family History  Problem Relation Age of  Onset  . Prostate cancer Father 37  . Dysrhythmia Father   . Hypertension Mother   . Diabetes Mother   . Alzheimer's disease Mother   . Heart attack Maternal Uncle 55  . Alzheimer's disease Maternal Aunt   . Cancer Paternal Aunt     ? primary  . Heart attack Maternal Aunt     ? > 65  . Stroke Neg Hx     Review of Systems  Constitutional: Negative for fever, chills, appetite change, fatigue and unexpected weight change.  HENT: Positive for hearing loss (mild).   Eyes: Negative for visual disturbance.  Respiratory: Positive for wheezing ([place] - smoking). Negative for cough and shortness of breath.   Cardiovascular: Negative for chest pain, palpitations and leg swelling.  Gastrointestinal: Negative for nausea, abdominal pain, diarrhea, constipation and blood in stool.       Gerd on occasion  Genitourinary: Negative for  dysuria, hematuria and difficulty urinating.  Musculoskeletal: Positive for arthralgias. Negative for myalgias and back pain.  Skin: Negative for color change and rash.  Neurological: Negative for dizziness, light-headedness, numbness and headaches.  Psychiatric/Behavioral: Negative for dysphoric mood. The patient is nervous/anxious.        Objective:   Filed Vitals:   01/14/16 0753  BP: 130/86  Pulse: 68  Temp: 97.8 F (36.6 C)  Resp: 16   Filed Weights   01/14/16 0753  Weight: 190 lb (86.183 kg)   Body mass index is 25.76 kg/(m^2).   Physical Exam Constitutional: He appears well-developed and well-nourished. No distress.  HENT:  Head: Normocephalic and atraumatic.  Right Ear: External ear normal.  Left Ear: External ear normal.  Mouth/Throat: Oropharynx is clear and moist.  Normal ear canals and TM b/l  Eyes: Conjunctivae and EOM are normal.  Neck: Neck supple. No tracheal deviation present. No thyromegaly present.  No carotid bruit  Cardiovascular: Normal rate, regular rhythm, normal heart sounds and intact distal pulses.   No murmur heard. Pulmonary/Chest: Effort normal and breath sounds normal. No respiratory distress. He has no wheezes. He has no rales.  Abdominal: Soft. Bowel sounds are normal. He exhibits no distension. There is no tenderness.  Genitourinary: deferred  Musculoskeletal: He exhibits no edema.  Lymphadenopathy:    He has no cervical adenopathy.  Skin: Skin is warm and dry. He is not diaphoretic.  Psychiatric: He has a normal mood and affect. His behavior is normal.         Assessment & Plan:   Physical exam: Screening blood work ordered Immunizations shingles vaccine today, other vaccines up to date Colonoscopy  Up to date Eye exams  Scheduled - next month EKG - last 2015 Exercise - active with work, no other regular exercise Weight normal BMI Skin - no conerns Substance abuse --  Smoking and does not want to quit, no other abuse.   Advised that he should quit and he knows that.   See Problem List for Assessment and Plan of chronic medical problems.  F/u annually

## 2016-01-14 NOTE — Assessment & Plan Note (Signed)
Lipids have been controlled with lifestyle Check lipid panel, cmp

## 2016-01-14 NOTE — Assessment & Plan Note (Signed)
Diabetes has been well controlled Check a1c, eye exam scheduled Continue current dose of metformin

## 2016-01-15 ENCOUNTER — Encounter: Payer: Self-pay | Admitting: Internal Medicine

## 2016-01-15 LAB — PSA, TOTAL AND FREE
PSA FREE PCT: 23 % — AB (ref >25)
PSA FREE: 0.17 ng/mL
PSA: 0.75 ng/mL (ref <=4.00)

## 2016-05-12 ENCOUNTER — Encounter: Payer: Self-pay | Admitting: Internal Medicine

## 2016-06-26 ENCOUNTER — Ambulatory Visit: Payer: BC Managed Care – PPO | Admitting: Sports Medicine

## 2016-06-26 ENCOUNTER — Ambulatory Visit
Admission: RE | Admit: 2016-06-26 | Discharge: 2016-06-26 | Disposition: A | Discharge status: Home / Self Care | Payer: BC Managed Care – PPO | Source: Ambulatory Visit | Attending: Sports Medicine | Admitting: Sports Medicine

## 2016-06-26 ENCOUNTER — Other Ambulatory Visit: Payer: Self-pay | Admitting: Sports Medicine

## 2016-06-26 ENCOUNTER — Encounter: Payer: Self-pay | Admitting: Sports Medicine

## 2016-06-26 ENCOUNTER — Ambulatory Visit (INDEPENDENT_AMBULATORY_CARE_PROVIDER_SITE_OTHER): Payer: BC Managed Care – PPO | Admitting: Sports Medicine

## 2016-06-26 VITALS — BP 149/60 | Ht 72.0 in | Wt 176.0 lb

## 2016-06-26 DIAGNOSIS — M25561 Pain in right knee: Secondary | ICD-10-CM

## 2016-06-26 MED ORDER — METHYLPREDNISOLONE ACETATE 40 MG/ML IJ SUSP
40.0000 mg | Freq: Once | INTRAMUSCULAR | Status: AC
  Administered 2016-06-26: 40 mg via INTRA_ARTICULAR

## 2016-06-26 NOTE — Progress Notes (Signed)
   Subjective:    Patient ID: Shane Adams, male    DOB: 04-Dec-1955, 60 y.o.   MRN: [id number]  HPI  Patient presents with R knee pain.   Patient reports that his pain began when he twisted his knee six weeks ago. Denies hearing or feeling any pops or cracks at that time. His pain began to improve, but he fell on the affected knee one week ago, and his pain has since worsened. He only reports pain when driving, and has had to begin driving with his L leg. 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 Denies any prior injuries to the R knee. Reports generalized pain. Has taken ibuprofen with no relief in symptoms. Endorses walking with a limp in the past week. Denies any swelling, bruising, or redness, either six weeks ago or one week ago. Denies hip or thigh pain.   Review of Systems No numbness, weakness, tingling, or sharp pains of the R knee or leg.     Objective:   Physical Exam  Constitutional: He is oriented to person, place, and time. He appears well-developed and well-nourished. No distress.  HENT:  Head: Normocephalic and atraumatic.  Pulmonary/Chest: Effort normal. No respiratory distress.  Musculoskeletal:  R knee:  Bony hypertrophy noted medially No swelling, erythema, ecchymosis  No TTP  Some crepitus under patella Full ROM Good strength Positive Thessaly's  Mild limp when walking  Neurological: He is alert and oriented to person, place, and time.  Skin: Skin is warm and dry.  Psychiatric: He has a normal mood and affect. His behavior is normal.    Consent obtained and verified. Sterile alcohol prep.  Topical analgesic spray: Ethyl chloride. Joint: R knee intra articular  Approached in typical fashion with: anterolateral Completed without difficulty Meds: 3 cc 1% xylocaine, 1 cc depomedrol Needle: 1.5 in 25 gauge Aftercare instructions and Red flags advised.     Assessment & Plan:  Right knee pain Likely combination of OA and possible meniscal tear. Suspect  tear when twisted knee 6 weeks ago due to trauma and preexisting degenerative changes. Steroid injection given in office today. If pain relief with injection, pain more likely 2/2 OA. If pain persists, more consistent with pain 2/2 meniscal tear and will pursue further treatment as such.  - Steroid injection given today - Obtain plain films  - Given note excusing patient from driving for work for the next week - F/u as needed   Adin Hector, MD, MPH PGY-2 [place] Pager [phone]  Patient seen and evaluated with the resident. I agree with the above plan of care. X-rays of the right knee were reviewed. There is some patellofemoral degenerative changes but the tibiofemoral space is fairly unremarkable. If patient's symptoms persist, consider further diagnostic imaging in the form of an MRI. Patient will follow-up from going or recalcitrant issues.

## 2016-06-26 NOTE — Assessment & Plan Note (Signed)
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 Suspect tear when twisted knee 6 weeks ago due to trauma and preexisting degenerative changes. Steroid injection given in office today. If pain relief with injection, pain more likely 2/2 OA. If pain persists, more consistent with pain 2/2 meniscal tear and will pursue further treatment as such.  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

## 2016-08-16 ENCOUNTER — Other Ambulatory Visit: Payer: Self-pay | Admitting: Internal Medicine

## 2017-01-13 NOTE — Progress Notes (Signed)
2   Subjective:    Patient ID: Shane Adams, male    DOB: [birth year], 61 y.o.   MRN: [id number]  HPI He is here for a physical exam.   Anxiety:  He still experiencing anxiety frequently.  He feels the ativan is not working as well and may need to be increased.  A few times a week he feels very anxious for no reason.    He is still smoking and has no desire to quit.   He is not always compliant with a diabetic diet.  He is taking his medication daily.  He is active at work, but does not exercise.    He uses the Advair about once a week.  He has allergies and smokes.  He has SOB and wheezing intermittently.  He denies a cough.   Medications and allergies reviewed with patient and updated if appropriate.  Patient Active Problem List   Diagnosis Date Noted  . Right knee pain 06/26/2016  . Obstructive sleep apnea 01/07/2013  . SMOKER 10/01/2009  . NEPHROLITHIASIS, HX OF 10/01/2009  . Diabetes ([place]) 09/24/2008  . History of colonic polyps 09/24/2008  . ERECTILE DYSFUNCTION 01/24/2008  . CARPAL TUNNEL SYNDROME 01/24/2008  . Anxiety state 05/31/2007  . BPH (benign prostatic hyperplasia) 05/31/2007  . HYPERLIPIDEMIA 05/09/2007  . Essential hypertension 05/09/2007  . 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 ADVAIR DISKUS 100-50 MCG/DOSE AEPB INHALE ONE DOSE BY MOUTH EVERY 12 HOURS 60 each 1  . aspirin 325 MG EC tablet Take 325 mg by mouth daily.    Marland Kitchen lisinopril (PRINIVIL,ZESTRIL) 10 MG tablet TAKE ONE TABLET BY MOUTH ONE TIME DAILY 90 tablet 3  . LORazepam (ATIVAN) 0.5 MG tablet 1 q 12 hrs prn only, not as maintenance 60 tablet 5  . metFORMIN (GLUCOPHAGE) 500 MG tablet 1/2 tablet daily with largest meal 45 tablet 3  . Multiple Vitamin (MULTIVITAMIN) tablet Take 1 tablet by mouth daily.    . sertraline (ZOLOFT) 50 MG tablet Take 1 tablet (50 mg total) by mouth daily. 90 tablet 3   No current  facility-administered medications on file prior to visit.     Past Medical History:  Diagnosis Date  . 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 Diabetes mellitus   . Gilbert's syndrome   . Hyperlipidemia   . Hypertension   . Kidney calculi 1976   passed spontaneously    Past Surgical History:  Procedure Laterality Date  . CARDIAC CATHETERIZATION  2004   mild CAD  . COLONOSCOPY W/ POLYPECTOMY  2007 & 2012   Dr Henrene Pastor  . TONSILLECTOMY  1984  . UVULECTOMY  1984   for sleep apnea     Social History   Social History  . Marital status: Married    Spouse name: N/A  . Number of children: N/A  . Years of education: N/A   Social History Main Topics  . Smoking status: Current Every Day Smoker    Packs/day: 1.00    Types: Cigarettes  . Smokeless tobacco: Never Used     Comment: < 1 ppd ( smoked 1973-present , up to 2 ppd); see Problem List  . Alcohol use No  . Drug use: No  . Sexual activity: Not on file   Other Topics Concern  . Not on file   Social History Narrative  . No narrative on file  Family History  Problem Relation Age of Onset  . Prostate cancer Father 66  . Dysrhythmia Father   . Hypertension Mother   . Diabetes Mother   . Alzheimer's disease Mother   . Heart attack Maternal Uncle 55  . Alzheimer's disease Maternal Aunt   . Cancer Paternal Aunt        ? primary  . Heart attack Maternal Aunt        ? > 65  . Stroke Neg Hx     Review of Systems  Constitutional: Negative for appetite change, chills, fatigue, fever and unexpected weight change.  Eyes: Negative for visual disturbance.  Respiratory: Positive for shortness of breath and wheezing. Negative for cough.   Cardiovascular: Negative for chest pain, palpitations and leg swelling.  Gastrointestinal: Negative for abdominal pain, blood in stool, constipation, diarrhea and nausea.       GERD occ  Genitourinary: Negative for difficulty urinating, dysuria and  hematuria.  Musculoskeletal: Positive for arthralgias (right knee pain). Negative for back pain.  Skin: Negative for color change and rash.  Neurological: Negative for light-headedness and headaches.  Psychiatric/Behavioral: Negative for dysphoric mood. The patient is nervous/anxious.        Objective:   Vitals:   01/15/17 0801  BP: 120/68  Pulse: 67  Resp: 16  Temp: 97.9 F (36.6 C)   Filed Weights   01/15/17 0801  Weight: 182 lb (82.6 kg)   Body mass index is 24.68 kg/m.  Wt Readings from Last 3 Encounters:  01/15/17 182 lb (82.6 kg)  06/26/16 176 lb (79.8 kg)  01/14/16 190 lb (86.2 kg)     Physical Exam Constitutional: He appears well-developed and well-nourished. No distress.  HENT:  Head: Normocephalic and atraumatic.  Right Ear: External ear normal.  Left Ear: External ear normal.  Mouth/Throat: Oropharynx is clear and moist.  Normal ear canals with minimal cerumen and normal TM b/l  Eyes: Conjunctivae and EOM are normal.  Neck: Neck supple. No tracheal deviation present. No thyromegaly present.  No carotid bruit  Cardiovascular: Normal rate, regular rhythm, normal heart sounds and intact distal pulses.  No murmur heard. Pulmonary/Chest: Effort normal and breath sounds normal. No respiratory distress. He has no wheezes. He has no rales.  Abdominal: Soft. Small ventral hernia - non tender, reducible.  He exhibits no distension. There is no tenderness.  Genitourinary: deferred by patient Musculoskeletal: He exhibits no edema.  Lymphadenopathy:   He has no cervical adenopathy.  Skin: Skin is warm and dry. He is not diaphoretic.  Psychiatric: He has a normal mood and affect. His behavior is normal.   Diabetic Foot Exam - Simple   Simple Foot Form Diabetic Foot exam was performed with the following findings:  Yes 01/15/2017  8:26 AM  Visual Inspection No deformities, no ulcerations, no other skin breakdown bilaterally:  Yes Sensation Testing Intact to touch and  monofilament testing bilaterally:  Yes Pulse Check Posterior Tibialis and Dorsalis pulse intact bilaterally:  Yes Comments          Assessment & Plan:   Physical exam: Screening blood work ordered Immunizations   Up to date, discussed shingrix Colonoscopy  Not up to date - due 2017 - will schedule Eye exams  - not up to date - will schedule EKG     Last done 2015,  EKG today given htn, smoking and diabetes:  EKG:  Sinus arhythm 66 bpm, variable atrial beat, unlikely afib, not sinus rhythm which his prior EKG's were --  given abnormal EKG and risk factors - will refer to cardiology Exercise: active at work, no regular exercise Weight - normal BMI Skin   No concerns Substance abuse  - smoking - no desire to quit - advised quitting, no alcohol  See Problem List for Assessment and Plan of chronic medical problems.  FU in 6 months

## 2017-01-13 NOTE — Assessment & Plan Note (Signed)
Well controlled with metformin - a1c has been in the prediabetic range Check a1c Low sugar / carb diet Stressed regular exercise

## 2017-01-13 NOTE — Patient Instructions (Addendum)
Schedule your eye appointment and your colonoscopy.    Test(s) ordered today. Your results will be released to [facility] (or called to you) after review, usually within 72hours after test completion. If any changes need to be made, you will be notified at that same time.  All other Health Maintenance issues reviewed.   All recommended immunizations and age-appropriate screenings are up-to-date or discussed.  No immunizations administered today.   An EKG was done today.   Medications reviewed and updated.  Changes include starting a cholesterol medication - generic lipitor 20 mg daily.  We will also increase your sertraline to 100 mg daily.   Your prescription(s) have been submitted to your pharmacy. Please take as directed and contact our office if you believe you are having problem(s) with the medication(s).   Please followup in 6 months   Health Maintenance, Male A healthy lifestyle and preventive care is important for your health and wellness. Ask your health care provider about what schedule of regular examinations is right for you. What should I know about weight and diet? Eat a Healthy Diet  Eat plenty of vegetables, fruits, whole grains, low-fat dairy products, and lean protein.  Do not eat a lot of foods high in solid fats, added sugars, or salt.  Maintain a Healthy Weight Regular exercise can help you achieve or maintain a healthy weight. You should:  Do at least 150 minutes of exercise each week. The exercise should increase your heart rate and make you sweat (moderate-intensity exercise).  Do strength-training exercises at least twice a week.  Watch Your Levels of Cholesterol and Blood Lipids  Have your blood tested for lipids and cholesterol every 5 years starting at 63 years of age. If you are at high risk for heart disease, you should start having your blood tested when you are 63 years old. You may need to have your cholesterol levels checked more often if: ? Your  lipid or cholesterol levels are high. ? You are older than 63 years of age. ? You are at high risk for heart disease.  What should I know about cancer screening? Many types of cancers can be detected early and may often be prevented. Lung Cancer  You should be screened every year for lung cancer if: ? You are a current smoker who has smoked for at least 30 years. ? You are a former smoker who has quit within the past 15 years.  Talk to your health care provider about your screening options, when you should start screening, and how often you should be screened.  Colorectal Cancer  Routine colorectal cancer screening usually begins at 63 years of age and should be repeated every 5-10 years until you are 63 years old. You may need to be screened more often if early forms of precancerous polyps or small growths are found. Your health care provider may recommend screening at an earlier age if you have risk factors for colon cancer.  Your health care provider may recommend using home test kits to check for hidden blood in the stool.  A small camera at the end of a tube can be used to examine your colon (sigmoidoscopy or colonoscopy). This checks for the earliest forms of colorectal cancer.  Prostate and Testicular Cancer  Depending on your age and overall health, your health care provider may do certain tests to screen for prostate and testicular cancer.  Talk to your health care provider about any symptoms or concerns you have about testicular or  prostate cancer.  Skin Cancer  Check your skin from head to toe regularly.  Tell your health care provider about any new moles or changes in moles, especially if: ? There is a change in a mole's size, shape, or color. ? You have a mole that is larger than a pencil eraser.  Always use sunscreen. Apply sunscreen liberally and repeat throughout the day.  Protect yourself by wearing long sleeves, pants, a wide-brimmed hat, and sunglasses when  outside.  What should I know about heart disease, diabetes, and high blood pressure?  If you are 66-66 years of age, have your blood pressure checked every 3-5 years. If you are 87 years of age or older, have your blood pressure checked every year. You should have your blood pressure measured twice-once when you are at a hospital or clinic, and once when you are not at a hospital or clinic. Record the average of the two measurements. To check your blood pressure when you are not at a hospital or clinic, you can use: ? An automated blood pressure machine at a pharmacy. ? A home blood pressure monitor.  Talk to your health care provider about your target blood pressure.  If you are between 41-75 years old, ask your health care provider if you should take aspirin to prevent heart disease.  Have regular diabetes screenings by checking your fasting blood sugar level. ? If you are at a normal weight and have a low risk for diabetes, have this test once every three years after the age of 58. ? If you are overweight and have a high risk for diabetes, consider being tested at a younger age or more often.  A one-time screening for abdominal aortic aneurysm (AAA) by ultrasound is recommended for men aged 57-75 years who are current or former smokers. What should I know about preventing infection? Hepatitis B If you have a higher risk for hepatitis B, you should be screened for this virus. Talk with your health care provider to find out if you are at risk for hepatitis B infection. Hepatitis C Blood testing is recommended for:  Everyone born from [birth year] through 1967.  Anyone with known risk factors for hepatitis C.  Sexually Transmitted Diseases (STDs)  You should be screened each year for STDs including gonorrhea and chlamydia if: ? You are sexually active and are younger than 63 years of age. ? You are older than 63 years of age and your health care provider tells you that you are at risk for this  type of infection. ? Your sexual activity has changed since you were last screened and you are at an increased risk for chlamydia or gonorrhea. Ask your health care provider if you are at risk.  Talk with your health care provider about whether you are at high risk of being infected with HIV. Your health care provider may recommend a prescription medicine to help prevent HIV infection.  What else can I do?  Schedule regular health, dental, and eye exams.  Stay current with your vaccines (immunizations).  Do not use any tobacco products, such as cigarettes, chewing tobacco, and e-cigarettes. If you need help quitting, ask your health care provider.  Limit alcohol intake to no more than 2 drinks per day. One drink equals 12 ounces of beer, 5 ounces of wine, or 1 ounces of hard liquor.  Do not use street drugs.  Do not share needles.  Ask your health care provider for help if you need support or  information about quitting drugs.  Tell your health care provider if you often feel depressed.  Tell your health care provider if you have ever been abused or do not feel safe at home. This information is not intended to replace advice given to you by your health care provider. Make sure you discuss any questions you have with your health care provider. Document Released: 12/23/2007 Document Revised: 02/23/2016 Document Reviewed: 03/30/2015 Elsevier Interactive Patient Education  [employer].

## 2017-01-15 ENCOUNTER — Ambulatory Visit (INDEPENDENT_AMBULATORY_CARE_PROVIDER_SITE_OTHER): Payer: BC Managed Care – PPO | Admitting: Internal Medicine

## 2017-01-15 ENCOUNTER — Other Ambulatory Visit (INDEPENDENT_AMBULATORY_CARE_PROVIDER_SITE_OTHER): Payer: BC Managed Care – PPO

## 2017-01-15 ENCOUNTER — Encounter: Payer: Self-pay | Admitting: Internal Medicine

## 2017-01-15 ENCOUNTER — Other Ambulatory Visit: Payer: Self-pay | Admitting: Internal Medicine

## 2017-01-15 VITALS — BP 120/68 | HR 67 | Temp 97.9°F | Resp 16 | Ht 72.0 in | Wt 182.0 lb

## 2017-01-15 DIAGNOSIS — E119 Type 2 diabetes mellitus without complications: Secondary | ICD-10-CM

## 2017-01-15 DIAGNOSIS — Z0000 Encounter for general adult medical examination without abnormal findings: Secondary | ICD-10-CM

## 2017-01-15 DIAGNOSIS — N400 Enlarged prostate without lower urinary tract symptoms: Secondary | ICD-10-CM | POA: Diagnosis not present

## 2017-01-15 DIAGNOSIS — F411 Generalized anxiety disorder: Secondary | ICD-10-CM

## 2017-01-15 DIAGNOSIS — I10 Essential (primary) hypertension: Secondary | ICD-10-CM | POA: Diagnosis not present

## 2017-01-15 DIAGNOSIS — F17200 Nicotine dependence, unspecified, uncomplicated: Secondary | ICD-10-CM

## 2017-01-15 DIAGNOSIS — E782 Mixed hyperlipidemia: Secondary | ICD-10-CM | POA: Diagnosis not present

## 2017-01-15 LAB — COMPREHENSIVE METABOLIC PANEL
ALBUMIN: 4.3 g/dL (ref 3.5–5.2)
ALK PHOS: 61 U/L (ref 39–117)
ALT: 9 U/L (ref 0–53)
AST: 11 U/L (ref 0–37)
BUN: 20 mg/dL (ref 6–23)
CALCIUM: 9.6 mg/dL (ref 8.4–10.5)
CHLORIDE: 105 meq/L (ref 96–112)
CO2: 27 mEq/L (ref 19–32)
Creatinine, Ser: 1.34 mg/dL (ref 0.40–1.50)
GFR: 57.25 mL/min — AB (ref >60.00)
Glucose, Bld: 123 mg/dL — ABNORMAL HIGH (ref 70–99)
POTASSIUM: 4.6 meq/L (ref 3.5–5.1)
Sodium: 140 mEq/L (ref 135–145)
TOTAL PROTEIN: 6.8 g/dL (ref 6.0–8.3)
Total Bilirubin: 0.7 mg/dL (ref 0.2–1.2)

## 2017-01-15 LAB — LIPID PANEL
CHOLESTEROL: 161 mg/dL (ref 0–200)
HDL: 32.4 mg/dL — AB (ref >39.00)
LDL Cholesterol: 97 mg/dL (ref 0–99)
NONHDL: 128.52
Total CHOL/HDL Ratio: 5
Triglycerides: 159 mg/dL — ABNORMAL HIGH (ref 0.0–149.0)
VLDL: 31.8 mg/dL (ref 0.0–40.0)

## 2017-01-15 LAB — CBC WITH DIFFERENTIAL/PLATELET
BASOS PCT: 0.5 % (ref 0.0–3.0)
Basophils Absolute: 0.1 10*3/uL (ref 0.0–0.1)
EOS PCT: 2.5 % (ref 0.0–5.0)
Eosinophils Absolute: 0.2 10*3/uL (ref 0.0–0.7)
HEMATOCRIT: 45.2 % (ref 39.0–52.0)
HEMOGLOBIN: 15.3 g/dL (ref 13.0–17.0)
LYMPHS PCT: 19.6 % (ref 12.0–46.0)
Lymphs Abs: 2 10*3/uL (ref 0.7–4.0)
MCHC: 33.9 g/dL (ref 30.0–36.0)
MCV: 86.5 fl (ref 78.0–100.0)
MONOS PCT: 6 % (ref 3.0–12.0)
Monocytes Absolute: 0.6 10*3/uL (ref 0.1–1.0)
Neutro Abs: 7.2 10*3/uL (ref 1.4–7.7)
Neutrophils Relative %: 71.4 % (ref 43.0–77.0)
Platelets: 670 10*3/uL — ABNORMAL HIGH (ref 150.0–400.0)
RBC: 5.22 Mil/uL (ref 4.22–5.81)
RDW: 16.3 % — AB (ref 11.5–15.5)
WBC: 10.1 10*3/uL (ref 4.0–10.5)

## 2017-01-15 LAB — TSH: TSH: 1.78 u[IU]/mL (ref 0.35–4.50)

## 2017-01-15 LAB — HEMOGLOBIN A1C: HEMOGLOBIN A1C: 6.5 % (ref 4.6–6.5)

## 2017-01-15 MED ORDER — LORAZEPAM 0.5 MG PO TABS: ORAL_TABLET | ORAL | 5 refills | Status: DC

## 2017-01-15 MED ORDER — LISINOPRIL 10 MG PO TABS: ORAL_TABLET | ORAL | 3 refills | Status: DC

## 2017-01-15 MED ORDER — ATORVASTATIN CALCIUM 20 MG PO TABS: 20.0000 mg | ORAL_TABLET | Freq: Every day | ORAL | 3 refills | Status: DC

## 2017-01-15 MED ORDER — METFORMIN HCL 500 MG PO TABS: ORAL_TABLET | ORAL | 3 refills | Status: DC

## 2017-01-15 MED ORDER — SERTRALINE HCL 100 MG PO TABS: 100.0000 mg | ORAL_TABLET | Freq: Every day | ORAL | 3 refills | Status: DC

## 2017-01-15 NOTE — Assessment & Plan Note (Signed)
Start lipitor 20 mg daily Regular exercise and healthy diet encouraged

## 2017-01-15 NOTE — Assessment & Plan Note (Signed)
BP well controlled Current regimen effective and well tolerated Continue current medications at current doses cmp  

## 2017-01-15 NOTE — Assessment & Plan Note (Signed)
Not controlled - has anxiety 3-4 times a week Increase sertraline to 100 mg daily Continue ativan at same dose as needed

## 2017-01-15 NOTE — Assessment & Plan Note (Signed)
Does not want to quit He understands the risks of smoking and that he should quit

## 2017-01-15 NOTE — Assessment & Plan Note (Signed)
No difficulty urinating Will check psa Deferred prostate exam

## 2017-01-16 ENCOUNTER — Telehealth: Payer: Self-pay | Admitting: Internal Medicine

## 2017-01-16 DIAGNOSIS — R9431 Abnormal electrocardiogram [ECG] [EKG]: Secondary | ICD-10-CM

## 2017-01-16 NOTE — Telephone Encounter (Signed)
Call him - his blood work shows slightly decreased kidney function - he needs to make sure he is drinking plenty of water during the day.   His liver tests are normal.  His cholesterol is good - his good cholesterol is on the low side - this is what protects his heart.  His thyroid function is normal.  His sugars are well controlled.  His platelet counts is elevated and stable, which is not new, but his other blood counts are normal.   His EKG showed a slight abnormality - it may not be anything but since he has diabetes and smokes and is high risk for heart disease I would like him to see a heart doctor. I have referred him.

## 2017-01-17 ENCOUNTER — Other Ambulatory Visit: Payer: BC Managed Care – PPO

## 2017-01-17 ENCOUNTER — Encounter: Payer: Self-pay | Admitting: Internal Medicine

## 2017-01-17 NOTE — Telephone Encounter (Signed)
Spoke with pt's wife to inform.  

## 2017-01-17 NOTE — Telephone Encounter (Signed)
LVM on pt primary number to call back to discuss results.

## 2017-01-17 NOTE — Telephone Encounter (Signed)
Spoke with pts wife to explain. Pts wife would like to know why pt needs to come back in 6 months when he has been coming every year in the past. Please advise.

## 2017-01-17 NOTE — Telephone Encounter (Signed)
Because of his chronic medical problems and the medications he is on he needs to be seen twice a year

## 2017-01-18 ENCOUNTER — Encounter: Payer: Self-pay | Admitting: Internal Medicine

## 2017-01-18 LAB — PSA, TOTAL AND FREE
PSA, % Free: 23 % — ABNORMAL LOW (ref >25)
PSA, Free: 0.3 ng/mL
PSA, Total: 1.3 ng/mL (ref <=4.0)

## 2017-01-18 MED ORDER — ASPIRIN EC 81 MG PO TBEC: 81.0000 mg | DELAYED_RELEASE_TABLET | Freq: Every day | ORAL | Status: AC

## 2017-01-19 ENCOUNTER — Encounter: Payer: Self-pay | Admitting: Internal Medicine

## 2017-02-09 ENCOUNTER — Ambulatory Visit (INDEPENDENT_AMBULATORY_CARE_PROVIDER_SITE_OTHER): Payer: BC Managed Care – PPO | Admitting: Cardiovascular Disease

## 2017-02-09 ENCOUNTER — Encounter: Payer: Self-pay | Admitting: Cardiovascular Disease

## 2017-02-09 VITALS — BP 122/80 | HR 71 | Ht 72.0 in | Wt 185.0 lb

## 2017-02-09 DIAGNOSIS — F17200 Nicotine dependence, unspecified, uncomplicated: Secondary | ICD-10-CM | POA: Diagnosis not present

## 2017-02-09 DIAGNOSIS — I2510 Atherosclerotic heart disease of native coronary artery without angina pectoris: Secondary | ICD-10-CM | POA: Diagnosis not present

## 2017-02-09 DIAGNOSIS — E782 Mixed hyperlipidemia: Secondary | ICD-10-CM

## 2017-02-09 DIAGNOSIS — I10 Essential (primary) hypertension: Secondary | ICD-10-CM

## 2017-02-09 NOTE — Assessment & Plan Note (Signed)
History of ongoing tobacco abuse smoking one to one half packs a day for last 45 years not interested in stopping

## 2017-02-09 NOTE — Assessment & Plan Note (Signed)
History of hyperlipidemia on statin therapy recently started subsequent to his recent lipid profile 01/15/17 revealing total cholesterol 61, LDL 97 and HDL of 32.

## 2017-02-09 NOTE — Assessment & Plan Note (Signed)
History of essential hypertension with blood pressure measured today at 122/80. He is on lisinopril. Continue current meds at current dosing

## 2017-02-09 NOTE — Assessment & Plan Note (Signed)
History of coronary calcification on CT the scan performed 01/20/14.

## 2017-02-09 NOTE — Progress Notes (Signed)
02/09/2017 [facility]   Sep 06, 1955  [id number]  Primary Physician Quay Burow, Claudina Lick, MD Primary Cardiologist: Lorretta Harp MD Lupe Carney, [place]  HPI:  Shane Adams is a 61 y.o. male married to his wife Jon Gills who works as a Marine scientist at [employer]. He was referred by Dr. Quay Burow for cardiovascular evaluation. He has no prior cardiac history. He did have a heart catheterization performed in 2004 that showed minimal CAD previous risk factors include treated hypertension, diabetes and hyperlipidemia. He smoked 6075 pack years currently smoking one half packs per day for recalcitrant to risk factor modification. His mother did die of a myocardial infarction at age 44. He is never had a heart attack or stroke. He does complain of shortness of breath but probably this is related to COPD. He denies chest pain.   Current Meds  Medication Sig  . ADVAIR DISKUS 100-50 MCG/DOSE AEPB INHALE ONE DOSE BY MOUTH EVERY 12 HOURS  . aspirin EC 81 MG tablet Take 1 tablet (81 mg total) by mouth daily.  Marland Kitchen atorvastatin (LIPITOR) 20 MG tablet Take 1 tablet (20 mg total) by mouth daily.  Marland Kitchen lisinopril (PRINIVIL,ZESTRIL) 10 MG tablet TAKE ONE TABLET BY MOUTH ONE TIME DAILY  . LORazepam (ATIVAN) 0.5 MG tablet 1 q 12 hrs prn only, not as maintenance  . metFORMIN (GLUCOPHAGE) 500 MG tablet 1/2 tablet daily with largest meal  . Multiple Vitamin (MULTIVITAMIN) tablet Take 1 tablet by mouth daily.  . sertraline (ZOLOFT) 100 MG tablet Take 1 tablet (100 mg total) by mouth daily.     Allergies  Allergen Reactions  . Tricor [Fenofibrate]     Hives  . Wellbutrin [Bupropion]     Intolerance; "didn't feel right"  . Niaspan [Niacin Er]     Joint pain    Social History   Social History  . Marital status: Married    Spouse name: N/A  . Number of children: N/A  . Years of education: N/A   Occupational History  . Not on file.   Social History Main Topics  . Smoking status: Current Every Day  Smoker    Packs/day: 1.00    Types: Cigarettes  . Smokeless tobacco: Never Used     Comment: < 1 ppd ( smoked 1973-present , up to 2 ppd); see Problem List  . Alcohol use No  . Drug use: No  . Sexual activity: Not on file   Other Topics Concern  . Not on file   Social History Narrative  . No narrative on file     Review of Systems: General: negative for chills, fever, night sweats or weight changes.  Cardiovascular: negative for chest pain, dyspnea on exertion, edema, orthopnea, palpitations, paroxysmal nocturnal dyspnea or shortness of breath Dermatological: negative for rash Respiratory: negative for cough or wheezing Urologic: negative for hematuria Abdominal: negative for nausea, vomiting, diarrhea, bright red blood per rectum, melena, or hematemesis Neurologic: negative for visual changes, syncope, or dizziness All other systems reviewed and are otherwise negative except as noted above.    Blood pressure 122/80, pulse 71, height 6' (1.829 m), weight 185 lb (83.9 kg).  General appearance: alert and no distress Neck: no adenopathy, no carotid bruit, no JVD, supple, symmetrical, trachea midline and thyroid not enlarged, symmetric, no tenderness/mass/nodules Lungs: clear to auscultation bilaterally Heart: regular rate and rhythm, S1, S2 normal, no murmur, click, rub or gallop Extremities: extremities normal, atraumatic, no cyanosis or edema  EKG sinus rhythm  at 70 with marked sinus arrhythmia and PACs. I personally reviewed this EKG.  ASSESSMENT AND PLAN:   HYPERLIPIDEMIA History of hyperlipidemia on statin therapy recently started subsequent to his recent lipid profile 01/15/17 revealing total cholesterol 61, LDL 97 and HDL of 32.  SMOKER History of ongoing tobacco abuse smoking one to one half packs a day for last 45 years not interested in stopping  Essential hypertension History of essential hypertension with blood pressure measured today at 122/80. He is on  lisinopril. Continue current meds at current dosing  Coronary artery calcification seen on CT scan History of coronary calcification on CT the scan performed 01/20/14.      Lorretta Harp MD [place], [facility] 02/09/2017 4:01 PM

## 2017-02-09 NOTE — Patient Instructions (Signed)

## 2017-02-19 ENCOUNTER — Encounter: Payer: Self-pay | Admitting: Internal Medicine

## 2017-06-29 ENCOUNTER — Other Ambulatory Visit: Payer: Self-pay | Admitting: Internal Medicine

## 2017-07-17 ENCOUNTER — Ambulatory Visit: Payer: BC Managed Care – PPO | Admitting: Internal Medicine

## 2018-01-16 NOTE — Patient Instructions (Addendum)
Test(s) ordered today. Your results will be released to [facility] (or called to you) after review, usually within 72hours after test completion. If any changes need to be made, you will be notified at that same time.  All other Health Maintenance issues reviewed.   All recommended immunizations and age-appropriate screenings are up-to-date or discussed.  No immunizations administered today.   Medications reviewed and updated.  Changes include increasing the sertraline to 150 mg daily.   Your prescription(s) have been submitted to your pharmacy. Please take as directed and contact our office if you believe you are having problem(s) with the medication(s).   Please followup in one year    Health Maintenance, Male A healthy lifestyle and preventive care is important for your health and wellness. Ask your health care provider about what schedule of regular examinations is right for you. What should I know about weight and diet? Eat a Healthy Diet  Eat plenty of vegetables, fruits, whole grains, low-fat dairy products, and lean protein.  Do not eat a lot of foods high in solid fats, added sugars, or salt.  Maintain a Healthy Weight Regular exercise can help you achieve or maintain a healthy weight. You should:  Do at least 150 minutes of exercise each week. The exercise should increase your heart rate and make you sweat (moderate-intensity exercise).  Do strength-training exercises at least twice a week.  Watch Your Levels of Cholesterol and Blood Lipids  Have your blood tested for lipids and cholesterol every 5 years starting at 64 years of age. If you are at high risk for heart disease, you should start having your blood tested when you are 64 years old. You may need to have your cholesterol levels checked more often if: ? Your lipid or cholesterol levels are high. ? You are older than 64 years of age. ? You are at high risk for heart disease.  What should I know about cancer  screening? Many types of cancers can be detected early and may often be prevented. Lung Cancer  You should be screened every year for lung cancer if: ? You are a current smoker who has smoked for at least 30 years. ? You are a former smoker who has quit within the past 15 years.  Talk to your health care provider about your screening options, when you should start screening, and how often you should be screened.  Colorectal Cancer  Routine colorectal cancer screening usually begins at 64 years of age and should be repeated every 5-10 years until you are 64 years old. You may need to be screened more often if early forms of precancerous polyps or small growths are found. Your health care provider may recommend screening at an earlier age if you have risk factors for colon cancer.  Your health care provider may recommend using home test kits to check for hidden blood in the stool.  A small camera at the end of a tube can be used to examine your colon (sigmoidoscopy or colonoscopy). This checks for the earliest forms of colorectal cancer.  Prostate and Testicular Cancer  Depending on your age and overall health, your health care provider may do certain tests to screen for prostate and testicular cancer.  Talk to your health care provider about any symptoms or concerns you have about testicular or prostate cancer.  Skin Cancer  Check your skin from head to toe regularly.  Tell your health care provider about any new moles or changes in moles, especially if: ?  There is a change in a mole's size, shape, or color. ? You have a mole that is larger than a pencil eraser.  Always use sunscreen. Apply sunscreen liberally and repeat throughout the day.  Protect yourself by wearing long sleeves, pants, a wide-brimmed hat, and sunglasses when outside.  What should I know about heart disease, diabetes, and high blood pressure?  If you are 60-10 years of age, have your blood pressure checked  every 3-5 years. If you are 87 years of age or older, have your blood pressure checked every year. You should have your blood pressure measured twice-once when you are at a hospital or clinic, and once when you are not at a hospital or clinic. Record the average of the two measurements. To check your blood pressure when you are not at a hospital or clinic, you can use: ? An automated blood pressure machine at a pharmacy. ? A home blood pressure monitor.  Talk to your health care provider about your target blood pressure.  If you are between 67-62 years old, ask your health care provider if you should take aspirin to prevent heart disease.  Have regular diabetes screenings by checking your fasting blood sugar level. ? If you are at a normal weight and have a low risk for diabetes, have this test once every three years after the age of 33. ? If you are overweight and have a high risk for diabetes, consider being tested at a younger age or more often.  A one-time screening for abdominal aortic aneurysm (AAA) by ultrasound is recommended for men aged 28-75 years who are current or former smokers. What should I know about preventing infection? Hepatitis B If you have a higher risk for hepatitis B, you should be screened for this virus. Talk with your health care provider to find out if you are at risk for hepatitis B infection. Hepatitis C Blood testing is recommended for:  Everyone born from [birth year] through 1967.  Anyone with known risk factors for hepatitis C.  Sexually Transmitted Diseases (STDs)  You should be screened each year for STDs including gonorrhea and chlamydia if: ? You are sexually active and are younger than 64 years of age. ? You are older than 64 years of age and your health care provider tells you that you are at risk for this type of infection. ? Your sexual activity has changed since you were last screened and you are at an increased risk for chlamydia or gonorrhea. Ask your  health care provider if you are at risk.  Talk with your health care provider about whether you are at high risk of being infected with HIV. Your health care provider may recommend a prescription medicine to help prevent HIV infection.  What else can I do?  Schedule regular health, dental, and eye exams.  Stay current with your vaccines (immunizations).  Do not use any tobacco products, such as cigarettes, chewing tobacco, and e-cigarettes. If you need help quitting, ask your health care provider.  Limit alcohol intake to no more than 2 drinks per day. One drink equals 12 ounces of beer, 5 ounces of wine, or 1 ounces of hard liquor.  Do not use street drugs.  Do not share needles.  Ask your health care provider for help if you need support or information about quitting drugs.  Tell your health care provider if you often feel depressed.  Tell your health care provider if you have ever been abused or do not feel  safe at home. This information is not intended to replace advice given to you by your health care provider. Make sure you discuss any questions you have with your health care provider. Document Released: 12/23/2007 Document Revised: 02/23/2016 Document Reviewed: 03/30/2015 Elsevier Interactive Patient Education  [employer].

## 2018-01-16 NOTE — Progress Notes (Signed)
Subjective:    Patient ID: Shane Adams, male    DOB: 1955/10/07, 62 y.o.   MRN: [id number]  HPI He is here for a physical exam.   He is here with his wife today.  He denies any changes in his history, except that he has significantly decreased his smoking.  He is using the patch.  He currently smokes 2-3 cigarettes a day, which is a significant decrease from what he was smoking before.  He does have a new granddaughter who was born few months ago and was diagnosed with alpha 1 antitrypsin deficiency.  Medications and allergies reviewed with patient and updated if appropriate.  Patient Active Problem List   Diagnosis Date Noted  . Coronary artery calcification seen on CT scan 02/09/2017  . Right knee pain 06/26/2016  . Obstructive sleep apnea 01/07/2013  . SMOKER 10/01/2009  . NEPHROLITHIASIS, HX OF 10/01/2009  . Diabetes ([place]) 09/24/2008  . History of colonic polyps 09/24/2008  . ERECTILE DYSFUNCTION 01/24/2008  . CARPAL TUNNEL SYNDROME 01/24/2008  . Anxiety state 05/31/2007  . BPH (benign prostatic hyperplasia) 05/31/2007  . HYPERLIPIDEMIA 05/09/2007  . Essential hypertension 05/09/2007  . 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 aspirin EC 81 MG tablet Take 1 tablet (81 mg total) by mouth daily.    Marland Kitchen atorvastatin (LIPITOR) 20 MG tablet Take 1 tablet (20 mg total) by mouth daily. 90 tablet 3  . Fluticasone-Salmeterol (ADVAIR DISKUS) 100-50 MCG/DOSE AEPB Inhale 1 puff into the lungs every 12 (twelve) hours. 90 each 1  . lisinopril (PRINIVIL,ZESTRIL) 10 MG tablet TAKE ONE TABLET BY MOUTH ONE TIME DAILY 90 tablet 3  . LORazepam (ATIVAN) 0.5 MG tablet 1 q 12 hrs prn only, not as maintenance 60 tablet 5  . metFORMIN (GLUCOPHAGE) 500 MG tablet 1/2 tablet daily with largest meal 45 tablet 3  . Multiple Vitamin (MULTIVITAMIN) tablet Take 1 tablet by mouth daily.    . sertraline (ZOLOFT) 100 MG  tablet Take 1 tablet (100 mg total) by mouth daily. 90 tablet 3   No current facility-administered medications on file prior to visit.     Past Medical History:  Diagnosis Date  . 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 Diabetes mellitus   . Gilbert's syndrome   . Hyperlipidemia   . Hypertension   . Kidney calculi 1976   passed spontaneously    Past Surgical History:  Procedure Laterality Date  . CARDIAC CATHETERIZATION  2004   mild CAD  . COLONOSCOPY W/ POLYPECTOMY  2007 & 2012   Dr Henrene Pastor  . TONSILLECTOMY  1984  . UVULECTOMY  1984   for sleep apnea     Social History   Socioeconomic History  . Marital status: Married    Spouse name: Not on file  . Number of children: Not on file  . Years of education: Not on file  . Highest education level: Not on file  Occupational History  . Not on file  Social Needs  . Financial resource strain: Not on file  . Food insecurity:    Worry: Not on file    Inability: Not on file  . Transportation needs:    Medical: Not on file    Non-medical: Not on file  Tobacco Use  . Smoking status: Current Every Day Smoker    Packs/day: 1.00    Types: Cigarettes  .  Smokeless tobacco: Never Used  . Tobacco comment: < 1 ppd ( smoked 1973-present , up to 2 ppd); see Problem List  Substance and Sexual Activity  . Alcohol use: No  . Drug use: No  . Sexual activity: Not on file  Lifestyle  . Physical activity:    Days per week: Not on file    Minutes per session: Not on file  . Stress: Not on file  Relationships  . Social connections:    Talks on phone: Not on file    Gets together: Not on file    Attends religious service: Not on file    Active member of club or organization: Not on file    Attends meetings of clubs or organizations: Not on file    Relationship status: Not on file  Other Topics Concern  . Not on file  Social History Narrative  . Not on file    Family History  Problem Relation  Age of Onset  . Prostate cancer Father 70  . Dysrhythmia Father   . Hypertension Mother   . Diabetes Mother   . Alzheimer's disease Mother   . Heart attack Maternal Uncle 55  . Alzheimer's disease Maternal Aunt   . Cancer Paternal Aunt        ? primary  . Heart attack Maternal Aunt        ? > 65  . Stroke Neg Hx     Review of Systems  Constitutional: Negative for chills, fatigue and fever.  Eyes: Negative for visual disturbance.  Respiratory: Positive for cough (related to smoking), shortness of breath (related to smoking) and wheezing (related to smoking).   Cardiovascular: Negative for chest pain, palpitations and leg swelling.  Gastrointestinal: Negative for abdominal pain, blood in stool, constipation, diarrhea and nausea.       GERD  Genitourinary: Negative for difficulty urinating, dysuria and hematuria.  Musculoskeletal: Negative for arthralgias and back pain.  Skin: Negative for color change and rash.  Neurological: Negative for dizziness, light-headedness and headaches.  Psychiatric/Behavioral: Negative for dysphoric mood. The patient is not nervous/anxious.        Objective:   Vitals:   01/17/18 0805  BP: 112/60  Pulse: 60  Resp: 16  Temp: 97.8 F (36.6 C)  SpO2: 98%   Filed Weights   01/17/18 0805  Weight: 189 lb (85.7 kg)   Body mass index is 25.63 kg/m.  Wt Readings from Last 3 Encounters:  01/17/18 189 lb (85.7 kg)  02/09/17 185 lb (83.9 kg)  01/15/17 182 lb (82.6 kg)     Physical Exam Constitutional: He appears well-developed and well-nourished. No distress.  HENT:  Head: Normocephalic and atraumatic.  Right Ear: External ear normal.  Left Ear: External ear normal.  Mouth/Throat: Oropharynx is clear and moist.  Normal ear canals and TM b/l  Eyes: Conjunctivae and EOM are normal.  Neck: Neck supple. No tracheal deviation present. No thyromegaly present.  No carotid bruit  Cardiovascular: Normal rate, regular rhythm, normal heart sounds  and intact distal pulses.  No murmur heard. Pulmonary/Chest: Effort normal and breath sounds normal. No respiratory distress.  Mild expiratory wheezes. He has no rales.  Abdominal: Soft. He exhibits no distension. There is no tenderness.  Genitourinary: deferred  Musculoskeletal: He exhibits no edema.  Lymphadenopathy:   He has no cervical adenopathy.  Skin: Skin is warm and dry. He is not diaphoretic.  Psychiatric: He has a normal mood and affect. His behavior is normal.  Assessment & Plan:   Physical exam: Screening blood work ordered Immunizations   Discussed shingrix, he deferred pneumonia vaccine, td up-to-date Colonoscopy  Overdue -he will schedule Eye exams  Refuses to go - not up to date-encouraged routine eye exams EKG    Done 02/2017 Exercise  Active at work - no other exercise Weight  BMI is good Skin   Would like skin ca screening - his wife will set up an appointment with  Substance abuse   Smokes - not interested in quitting, no other drug abuse  See Problem List for Assessment and Plan of chronic medical problems.   Okay to continue annual physicals

## 2018-01-17 ENCOUNTER — Encounter: Payer: Self-pay | Admitting: Internal Medicine

## 2018-01-17 ENCOUNTER — Other Ambulatory Visit (INDEPENDENT_AMBULATORY_CARE_PROVIDER_SITE_OTHER): Payer: BC Managed Care – PPO

## 2018-01-17 ENCOUNTER — Ambulatory Visit (INDEPENDENT_AMBULATORY_CARE_PROVIDER_SITE_OTHER): Payer: BC Managed Care – PPO | Admitting: Internal Medicine

## 2018-01-17 VITALS — BP 112/60 | HR 60 | Temp 97.8°F | Resp 16 | Ht 72.0 in | Wt 189.0 lb

## 2018-01-17 DIAGNOSIS — F411 Generalized anxiety disorder: Secondary | ICD-10-CM

## 2018-01-17 DIAGNOSIS — I10 Essential (primary) hypertension: Secondary | ICD-10-CM | POA: Diagnosis not present

## 2018-01-17 DIAGNOSIS — J449 Chronic obstructive pulmonary disease, unspecified: Secondary | ICD-10-CM | POA: Diagnosis not present

## 2018-01-17 DIAGNOSIS — Z0000 Encounter for general adult medical examination without abnormal findings: Secondary | ICD-10-CM

## 2018-01-17 DIAGNOSIS — Z8349 Family history of other endocrine, nutritional and metabolic diseases: Secondary | ICD-10-CM

## 2018-01-17 DIAGNOSIS — E119 Type 2 diabetes mellitus without complications: Secondary | ICD-10-CM | POA: Diagnosis not present

## 2018-01-17 DIAGNOSIS — E782 Mixed hyperlipidemia: Secondary | ICD-10-CM | POA: Diagnosis not present

## 2018-01-17 LAB — COMPREHENSIVE METABOLIC PANEL
ALBUMIN: 4.3 g/dL (ref 3.5–5.2)
ALK PHOS: 65 U/L (ref 39–117)
ALT: 12 U/L (ref 0–53)
AST: 13 U/L (ref 0–37)
BILIRUBIN TOTAL: 0.7 mg/dL (ref 0.2–1.2)
BUN: 18 mg/dL (ref 6–23)
CALCIUM: 9.4 mg/dL (ref 8.4–10.5)
CHLORIDE: 106 meq/L (ref 96–112)
CO2: 26 mEq/L (ref 19–32)
CREATININE: 1.36 mg/dL (ref 0.40–1.50)
GFR: 56.1 mL/min — ABNORMAL LOW (ref >60.00)
Glucose, Bld: 131 mg/dL — ABNORMAL HIGH (ref 70–99)
Potassium: 4.5 mEq/L (ref 3.5–5.1)
Sodium: 141 mEq/L (ref 135–145)
Total Protein: 6.7 g/dL (ref 6.0–8.3)

## 2018-01-17 LAB — LIPID PANEL
CHOLESTEROL: 101 mg/dL (ref 0–200)
HDL: 31.3 mg/dL — ABNORMAL LOW (ref >39.00)
LDL CALC: 38 mg/dL (ref 0–99)
NonHDL: 69.27
TRIGLYCERIDES: 155 mg/dL — AB (ref 0.0–149.0)
Total CHOL/HDL Ratio: 3
VLDL: 31 mg/dL (ref 0.0–40.0)

## 2018-01-17 LAB — CBC WITH DIFFERENTIAL/PLATELET
BASOS ABS: 0.2 10*3/uL — AB (ref 0.0–0.1)
BASOS PCT: 2 % (ref 0.0–3.0)
EOS ABS: 0.2 10*3/uL (ref 0.0–0.7)
Eosinophils Relative: 2.5 % (ref 0.0–5.0)
HEMATOCRIT: 41.3 % (ref 39.0–52.0)
Hemoglobin: 14.1 g/dL (ref 13.0–17.0)
LYMPHS PCT: 17.3 % (ref 12.0–46.0)
Lymphs Abs: 1.3 10*3/uL (ref 0.7–4.0)
MCHC: 34.1 g/dL (ref 30.0–36.0)
MCV: 87.1 fl (ref 78.0–100.0)
Monocytes Absolute: 0.4 10*3/uL (ref 0.1–1.0)
Monocytes Relative: 5.9 % (ref 3.0–12.0)
Neutro Abs: 5.4 10*3/uL (ref 1.4–7.7)
Neutrophils Relative %: 72.3 % (ref 43.0–77.0)
Platelets: 412 10*3/uL — ABNORMAL HIGH (ref 150.0–400.0)
RBC: 4.74 Mil/uL (ref 4.22–5.81)
RDW: 16.5 % — ABNORMAL HIGH (ref 11.5–15.5)
WBC: 7.5 10*3/uL (ref 4.0–10.5)

## 2018-01-17 LAB — HEMOGLOBIN A1C: Hgb A1c MFr Bld: 6.6 % — ABNORMAL HIGH (ref 4.6–6.5)

## 2018-01-17 LAB — TSH: TSH: 2.57 u[IU]/mL (ref 0.35–4.50)

## 2018-01-17 MED ORDER — METFORMIN HCL 500 MG PO TABS: ORAL_TABLET | ORAL | 3 refills | Status: DC

## 2018-01-17 MED ORDER — ATORVASTATIN CALCIUM 20 MG PO TABS: 20.0000 mg | ORAL_TABLET | Freq: Every day | ORAL | 3 refills | Status: DC

## 2018-01-17 MED ORDER — LORAZEPAM 0.5 MG PO TABS: ORAL_TABLET | ORAL | 5 refills | Status: DC

## 2018-01-17 MED ORDER — SERTRALINE HCL 100 MG PO TABS: 150.0000 mg | ORAL_TABLET | Freq: Every day | ORAL | 3 refills | Status: DC

## 2018-01-17 MED ORDER — LISINOPRIL 10 MG PO TABS: ORAL_TABLET | ORAL | 3 refills | Status: DC

## 2018-01-17 NOTE — Assessment & Plan Note (Signed)
Longtime smoker-working on quitting and currently only smoking 2-3 cigarettes a day with use of patches I believe he will quit completely Has daily cough, wheeze and shortness of breath Uses Advair as needed Granddaughter has alpha 1 antitrypsin deficiency so he should be checked-ordered Encouraged him to continue smoking cessation efforts Consider pulmonary referral Discussed low-dose CT annually for lung cancer screening-he will consider

## 2018-01-17 NOTE — Assessment & Plan Note (Signed)
Granddaughter has alpha-1 antitrypsin deficiency He does have COPD and is a smoker We will check blood work to see if he is a carrier or possibly has alpha-1 antitrypsin deficiency Continue smoking cessation

## 2018-01-17 NOTE — Assessment & Plan Note (Addendum)
Having increased anxiety since quitting smoking Will increase sertraline to 150 mg daily Rarely uses lorazepam-okay to use as needed

## 2018-01-17 NOTE — Assessment & Plan Note (Signed)
Check A1c Continue metformin-we will adjust if needed He is very active, but not exercising regularly Diabetic diet Deferred having an eye exam, but encouraged routine eye exams

## 2018-01-17 NOTE — Assessment & Plan Note (Signed)
Check lipid panel, CMP, TSH Continue daily statin Regular exercise and healthy diet encouraged  

## 2018-01-17 NOTE — Assessment & Plan Note (Signed)
BP well controlled Current regimen effective and well tolerated Continue current medications at current doses cmp  

## 2018-01-18 LAB — PSA, TOTAL AND FREE
PSA, % Free: 27 % (calc) (ref >25)
PSA, Free: 0.3 ng/mL
PSA, Total: 1.1 ng/mL (ref <=4.0)

## 2018-01-21 LAB — ALPHA-1-ANTITRYPSIN: A-1 Antitrypsin, Ser: 139 mg/dL (ref 83–199)

## 2018-01-23 ENCOUNTER — Encounter: Payer: Self-pay | Admitting: Internal Medicine

## 2018-01-24 ENCOUNTER — Encounter: Payer: Self-pay | Admitting: Internal Medicine

## 2018-02-15 ENCOUNTER — Encounter: Payer: Self-pay | Admitting: Internal Medicine

## 2018-04-03 ENCOUNTER — Ambulatory Visit (AMBULATORY_SURGERY_CENTER): Payer: Self-pay | Admitting: *Deleted

## 2018-04-03 ENCOUNTER — Other Ambulatory Visit: Payer: Self-pay

## 2018-04-03 ENCOUNTER — Encounter: Payer: Self-pay | Admitting: Internal Medicine

## 2018-04-03 VITALS — Ht 72.0 in | Wt 191.4 lb

## 2018-04-03 DIAGNOSIS — Z86010 Personal history of colonic polyps: Secondary | ICD-10-CM

## 2018-04-03 MED ORDER — SUPREP BOWEL PREP KIT 17.5-3.13-1.6 GM/177ML PO SOLN: 1.0000 | Freq: Once | ORAL | 0 refills | Status: AC

## 2018-04-03 NOTE — Progress Notes (Signed)
No egg or soy allergy known to patient  No issues with past sedation with any surgeries  or procedures, no intubation problems  No diet pills per patient No home 02 use per patient  No blood thinners per patient  Pt denies issues with constipation  No A fib or A flutter  EMMI video offered, patient declined, no email account suprep 15.00 coupon given

## 2018-04-17 ENCOUNTER — Encounter: Payer: Self-pay | Admitting: Internal Medicine

## 2018-04-17 ENCOUNTER — Ambulatory Visit (AMBULATORY_SURGERY_CENTER): Payer: BC Managed Care – PPO | Admitting: Internal Medicine

## 2018-04-17 VITALS — BP 105/59 | HR 58 | Temp 97.7°F | Resp 16 | Ht 72.0 in | Wt 191.0 lb

## 2018-04-17 DIAGNOSIS — D120 Benign neoplasm of cecum: Secondary | ICD-10-CM | POA: Diagnosis not present

## 2018-04-17 DIAGNOSIS — Z86010 Personal history of colonic polyps: Secondary | ICD-10-CM | POA: Diagnosis not present

## 2018-04-17 MED ORDER — SODIUM CHLORIDE 0.9 % IV SOLN: 500.0000 mL | Freq: Once | INTRAVENOUS | Status: DC

## 2018-04-17 NOTE — Op Note (Signed)
[facility] Patient Name: Shane Adams Procedure Date: 04/17/2018 8:01 AM MRN: [id number] Endoscopist: Docia Chuck. Henrene Pastor , MD Age: 62 Referring MD:  Date of Birth: Dec 02, 1955 Gender: Male Account #: [id number] Procedure:                Colonoscopy with cold snare polypectomy x 1 Indications:              High risk colon cancer surveillance: Personal                            history of multiple (3 or more) adenomas. Previous                            examinations 2007 and 2012 Medicines:                Monitored Anesthesia Care Procedure:                Pre-Anesthesia Assessment:                           - Prior to the procedure, a History and Physical                            was performed, and patient medications and                            allergies were reviewed. The patient's tolerance of                            previous anesthesia was also reviewed. The risks                            and benefits of the procedure and the sedation                            options and risks were discussed with the patient.                            All questions were answered, and informed consent                            was obtained. Prior Anticoagulants: The patient has                            taken no previous anticoagulant or antiplatelet                            agents. ASA Grade Assessment: II - A patient with                            mild systemic disease. After reviewing the risks                            and benefits, the patient was deemed in  satisfactory condition to undergo the procedure.                           After obtaining informed consent, the colonoscope                            was passed under direct vision. Throughout the                            procedure, the patient's blood pressure, pulse, and                            oxygen saturations were monitored continuously. The                            Model  CF-HQ190L [id number]) scope was introduced                            through the anus and advanced to the the cecum,                            identified by appendiceal orifice and ileocecal                            valve. The ileocecal valve, appendiceal orifice,                            and rectum were photographed. The quality of the                            bowel preparation was good. The colonoscopy was                            performed without difficulty. The patient tolerated                            the procedure well. The bowel preparation used was                            SUPREP. Scope In: 8:22:21 AM Scope Out: 8:38:37 AM Scope Withdrawal Time: 0 hours 11 minutes 49 seconds  Total Procedure Duration: 0 hours 16 minutes 16 seconds  Findings:                 A 1 mm polyp was found in the cecum. The polyp was                            removed with a cold snare. Resection and retrieval                            were complete.                           A few medium-mouthed diverticula were found in the  sigmoid colon.                           Internal hemorrhoids were found during retroflexion.                           The exam was otherwise without abnormality on                            direct and retroflexion views. Complications:            No immediate complications. Estimated blood loss:                            None. Estimated Blood Loss:     Estimated blood loss: none. Impression:               - One 1 mm polyp in the cecum, removed with a cold                            snare. Resected and retrieved.                           - Diverticulosis in the sigmoid colon.                           - Internal hemorrhoids.                           - The examination was otherwise normal on direct                            and retroflexion views. Recommendation:           - Repeat colonoscopy in 5 years for surveillance.                            - Patient has a contact number available for                            emergencies. The signs and symptoms of potential                            delayed complications were discussed with the                            patient. Return to normal activities tomorrow.                            Written discharge instructions were provided to the                            patient.                           - Resume previous diet.                           -  Continue present medications.                           - Await pathology results. Docia Chuck. Henrene Pastor, MD 04/17/2018 8:48:57 AM This report has been signed electronically.

## 2018-04-17 NOTE — Progress Notes (Signed)
Report to PACU, RN, vss, BBS= Clear.  

## 2018-04-17 NOTE — Progress Notes (Signed)
Pt's states no medical or surgical changes since previsit or office visit. 

## 2018-04-17 NOTE — Progress Notes (Signed)
Called to room to assist during endoscopic procedure.  Patient ID and intended procedure confirmed with present staff. Received instructions for my participation in the procedure from the performing physician.  

## 2018-04-17 NOTE — Patient Instructions (Signed)
HANDOUTS GIVEN FOR POLYPS, DIVERTICULOSIS AND HEMORRHOIDS  YOU HAD AN ENDOSCOPIC PROCEDURE TODAY AT THE [facility]:   Refer to the procedure report that was given to you for any specific questions about what was found during the examination.  If the procedure report does not answer your questions, please call your gastroenterologist to clarify.  If you requested that your care partner not be given the details of your procedure findings, then the procedure report has been included in a sealed envelope for you to review at your convenience later.  YOU SHOULD EXPECT: Some feelings of bloating in the abdomen. Passage of more gas than usual.  Walking can help get rid of the air that was put into your GI tract during the procedure and reduce the bloating. If you had a lower endoscopy (such as a colonoscopy or flexible sigmoidoscopy) you may notice spotting of blood in your stool or on the toilet paper. If you underwent a bowel prep for your procedure, you may not have a normal bowel movement for a few days.  Please Note:  You might notice some irritation and congestion in your nose or some drainage.  This is from the oxygen used during your procedure.  There is no need for concern and it should clear up in a day or so.  SYMPTOMS TO REPORT IMMEDIATELY:   Following lower endoscopy (colonoscopy or flexible sigmoidoscopy):  Excessive amounts of blood in the stool  Significant tenderness or worsening of abdominal pains  Swelling of the abdomen that is new, acute  Fever of 100F or higher  For urgent or emergent issues, a gastroenterologist can be reached at any hour by calling [phone].   DIET:  We do recommend a small meal at first, but then you may proceed to your regular diet.  Drink plenty of fluids but you should avoid alcoholic beverages for 24 hours.  ACTIVITY:  You should plan to take it easy for the rest of today and you should NOT DRIVE or use heavy machinery until tomorrow  (because of the sedation medicines used during the test).    FOLLOW UP: Our staff will call the number listed on your records the next business day following your procedure to check on you and address any questions or concerns that you may have regarding the information given to you following your procedure. If we do not reach you, we will leave a message.  However, if you are feeling well and you are not experiencing any problems, there is no need to return our call.  We will assume that you have returned to your regular daily activities without incident.  If any biopsies were taken you will be contacted by phone or by letter within the next 1-3 weeks.  Please call us at [phone] if you have not heard about the biopsies in 3 weeks.    SIGNATURES/CONFIDENTIALITY: You and/or your care partner have signed paperwork which will be entered into your electronic medical record.  These signatures attest to the fact that that the information above on your After Visit Summary has been reviewed and is understood.  Full responsibility of the confidentiality of this discharge information lies with you and/or your care-partner. 

## 2018-04-18 ENCOUNTER — Telehealth: Payer: Self-pay

## 2018-04-18 NOTE — Telephone Encounter (Signed)
  Follow up Call-  Call back number 04/17/2018  Post procedure Call Back phone  # [phone]  Permission to leave phone message Yes  Some recent data might be hidden     Patient questions:  Do you have a fever, pain , or abdominal swelling? No. Pain Score  0 *  Have you tolerated food without any problems? Yes.    Have you been able to return to your normal activities? Yes.    Do you have any questions about your discharge instructions: Diet   No. Medications  No. Follow up visit  No.  Do you have questions or concerns about your Care? No.  Actions: * If pain score is 4 or above: No action needed, pain <4.

## 2018-04-23 ENCOUNTER — Encounter: Payer: Self-pay | Admitting: Internal Medicine

## 2018-10-16 ENCOUNTER — Other Ambulatory Visit: Payer: Self-pay | Admitting: Internal Medicine

## 2019-01-26 ENCOUNTER — Other Ambulatory Visit: Payer: Self-pay | Admitting: Internal Medicine

## 2019-02-02 NOTE — Progress Notes (Signed)
Subjective:    Patient ID: Shane Adams, male    DOB: 03/16/1956, 63 y.o.   MRN: [id number]  HPI He is here for a physical exam.   He denies any changes in his history and has no concerns.   Medications and allergies reviewed with patient and updated if appropriate.  Patient Active Problem List   Diagnosis Date Noted  . Family history of alpha 1 antitrypsin deficiency 01/17/2018  . Chronic obstructive pulmonary disease ([place]) 01/17/2018  . Coronary artery calcification seen on CT scan 02/09/2017  . Right knee pain 06/26/2016  . Obstructive sleep apnea 01/07/2013  . SMOKER 10/01/2009  . NEPHROLITHIASIS, HX OF 10/01/2009  . Diabetes ([place]) 09/24/2008  . History of colonic polyps 09/24/2008  . ERECTILE DYSFUNCTION 01/24/2008  . CARPAL TUNNEL SYNDROME 01/24/2008  . Anxiety state 05/31/2007  . BPH (benign prostatic hyperplasia) 05/31/2007  . HYPERLIPIDEMIA 05/09/2007  . Essential hypertension 05/09/2007  . 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 aspirin EC 81 MG tablet Take 1 tablet (81 mg total) by mouth daily.    Marland Kitchen atorvastatin (LIPITOR) 20 MG tablet Take 1 tablet by mouth once daily 90 tablet 0  . Fluticasone-Salmeterol (ADVAIR) 100-50 MCG/DOSE AEPB INHALE 1 DOSE BY MOUTH EVERY 12 HOURS 60 each 1  . lisinopril (ZESTRIL) 10 MG tablet Take 1 tablet by mouth once daily 90 tablet 0  . Multiple Vitamin (MULTIVITAMIN) tablet Take 1 tablet by mouth daily.    . nicotine (NICODERM CQ - DOSED IN MG/24 HR) 7 mg/24hr patch Place 7 mg onto the skin daily.     No current facility-administered medications on file prior to visit.     Past Medical History:  Diagnosis Date  . Anxiety   . Arthritis   . 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 Diabetes mellitus   . Emphysema of lung ([place])   . GERD (gastroesophageal reflux disease)   . Gilbert's syndrome   .  Hyperlipidemia   . Hypertension   . Kidney calculi 1976   passed spontaneously    Past Surgical History:  Procedure Laterality Date  . CARDIAC CATHETERIZATION  2004   mild CAD  . COLONOSCOPY W/ POLYPECTOMY  2007 & 2012   Dr Henrene Pastor  . TONSILLECTOMY  1984  . UVULECTOMY  1984   for sleep apnea     Social History   Socioeconomic History  . Marital status: Married    Spouse name: Not on file  . Number of children: Not on file  . Years of education: Not on file  . Highest education level: Not on file  Occupational History  . Not on file  Social Needs  . Financial resource strain: Not on file  . Food insecurity    Worry: Not on file    Inability: Not on file  . Transportation needs    Medical: Not on file    Non-medical: Not on file  Tobacco Use  . Smoking status: Current Every Day Smoker    Packs/day: 0.25    Types: Cigarettes  . Smokeless tobacco: Never Used  . Tobacco comment: < 1 ppd ( smoked 1973-present , up to 2 ppd); see Problem List  Substance and Sexual Activity  . Alcohol use: No  . Drug use: No  . Sexual activity: Not on file  Lifestyle  . Physical activity    Days  per week: Not on file    Minutes per session: Not on file  . Stress: Not on file  Relationships  . Social Herbalist on phone: Not on file    Gets together: Not on file    Attends religious service: Not on file    Active member of club or organization: Not on file    Attends meetings of clubs or organizations: Not on file    Relationship status: Not on file  Other Topics Concern  . Not on file  Social History Narrative  . Not on file    Family History  Problem Relation Age of Onset  . Prostate cancer Father 35  . Dysrhythmia Father   . Hypertension Mother   . Diabetes Mother   . Alzheimer's disease Mother   . Heart attack Maternal Uncle 55  . Alzheimer's disease Maternal Aunt   . Cancer Paternal Aunt        ? primary  . Heart attack Maternal Aunt        ? > 65  .  Stroke Neg Hx   . Colon cancer Neg Hx   . Colon polyps Neg Hx   . Esophageal cancer Neg Hx   . Stomach cancer Neg Hx   . Rectal cancer Neg Hx     Review of Systems  Constitutional: Negative for chills and fever.  Eyes: Positive for visual disturbance.  Respiratory: Positive for shortness of breath (with exertion). Negative for cough and wheezing.   Cardiovascular: Negative for chest pain, palpitations and leg swelling.  Gastrointestinal: Negative for abdominal pain, blood in stool, constipation, diarrhea and nausea.       Occ GERD  - take otc medication  Genitourinary: Negative for dysuria and hematuria.  Musculoskeletal: Positive for arthralgias. Negative for back pain.  Skin: Negative for color change and rash.  Neurological: Negative for light-headedness, numbness and headaches.  Psychiatric/Behavioral: Negative for dysphoric mood. The patient is nervous/anxious (intermittent).        Objective:   Vitals:   02/04/19 0759  BP: 128/68  Pulse: 63  Resp: 16  Temp: 98.3 F (36.8 C)  SpO2: 98%   Filed Weights   02/04/19 0759  Weight: 189 lb (85.7 kg)   Body mass index is 25.63 kg/m.  Wt Readings from Last 3 Encounters:  02/04/19 189 lb (85.7 kg)  04/17/18 191 lb (86.6 kg)  04/03/18 191 lb 6.4 oz (86.8 kg)     Physical Exam Constitutional: He appears well-developed and well-nourished. No distress.  HENT:  Head: Normocephalic and atraumatic.  Right Ear: External ear normal.  Left Ear: External ear normal.  Mouth/Throat: Oropharynx is clear and moist.  Normal ear canals and TM b/l  Eyes: Conjunctivae and EOM are normal.  Neck: Neck supple. No tracheal deviation present. No thyromegaly present.  No carotid bruit  Cardiovascular: Normal rate, regular rhythm, normal heart sounds and intact distal pulses.   No murmur heard. Pulmonary/Chest: Effort normal and breath sounds normal. No respiratory distress. He has no wheezes. He has no rales.  Abdominal: Soft. He  exhibits no distension. There is no tenderness.  Genitourinary: deferred  Musculoskeletal: He exhibits no edema.  Lymphadenopathy:   He has no cervical adenopathy.  Skin: Skin is warm and dry. He is not diaphoretic.  Psychiatric: He has a normal mood and affect. His behavior is normal.    Diabetic Foot Exam - Simple   Simple Foot Form Diabetic Foot exam was performed with the  following findings: Yes 02/04/2019  8:19 AM  Visual Inspection No deformities, no ulcerations, no other skin breakdown bilaterally: Yes Sensation Testing Intact to touch and monofilament testing bilaterally: Yes Pulse Check Posterior Tibialis and Dorsalis pulse intact bilaterally: Yes Comments         Assessment & Plan:   Physical exam: Screening blood work   ordered Immunizations  Discussed shingrix Colonoscopy   Up to date  Eye exams  Not up to date - advised yearly eye exams Exercise  Yard work Weight   BMI normal Skin  No concerns Substance abuse smokes -  3-5 cigs a day - not ready to quit  See Problem List for Assessment and Plan of chronic medical problems.   FU in one year - given stability of chronic medical problems ok for one year f/u

## 2019-02-02 NOTE — Patient Instructions (Addendum)
Tests ordered today. Your results will be released to [place] (or called to you) after review.  If any changes need to be made, you will be notified at that same time.  All other Health Maintenance issues reviewed.   All recommended immunizations and age-appropriate screenings are up-to-date or discussed.  No immunization administered today.   Medications reviewed and updated.  Changes include :   none  Your prescription(s) have been submitted to your pharmacy. Please take as directed and contact our office if you believe you are having problem(s) with the medication(s).   Please followup in 1 year    Health Maintenance, Male Adopting a healthy lifestyle and getting preventive care are important in promoting health and wellness. Ask your health care provider about:  The right schedule for you to have regular tests and exams.  Things you can do on your own to prevent diseases and keep yourself healthy. What should I know about diet, weight, and exercise? Eat a healthy diet   Eat a diet that includes plenty of vegetables, fruits, low-fat dairy products, and lean protein.  Do not eat a lot of foods that are high in solid fats, added sugars, or sodium. Maintain a healthy weight Body mass index (BMI) is a measurement that can be used to identify possible weight problems. It estimates body fat based on height and weight. Your health care provider can help determine your BMI and help you achieve or maintain a healthy weight. Get regular exercise Get regular exercise. This is one of the most important things you can do for your health. Most adults should:  Exercise for at least 150 minutes each week. The exercise should increase your heart rate and make you sweat (moderate-intensity exercise).  Do strengthening exercises at least twice a week. This is in addition to the moderate-intensity exercise.  Spend less time sitting. Even light physical activity can be beneficial. Watch  cholesterol and blood lipids Have your blood tested for lipids and cholesterol at 65 years of age, then have this test every 5 years. You may need to have your cholesterol levels checked more often if:  Your lipid or cholesterol levels are high.  You are older than 65 years of age.  You are at high risk for heart disease. What should I know about cancer screening? Many types of cancers can be detected early and may often be prevented. Depending on your health history and family history, you may need to have cancer screening at various ages. This may include screening for:  Colorectal cancer.  Prostate cancer.  Skin cancer.  Lung cancer. What should I know about heart disease, diabetes, and high blood pressure? Blood pressure and heart disease  High blood pressure causes heart disease and increases the risk of stroke. This is more likely to develop in people who have high blood pressure readings, are of African descent, or are overweight.  Talk with your health care provider about your target blood pressure readings.  Have your blood pressure checked: ? Every 3-5 years if you are 30-41 years of age. ? Every year if you are 74 years old or older.  If you are between the ages of 80 and 38 and are a current or former smoker, ask your health care provider if you should have a one-time screening for abdominal aortic aneurysm (AAA). Diabetes Have regular diabetes screenings. This checks your fasting blood sugar level. Have the screening done:  Once every three years after age 16 if you are at  a normal weight and have a low risk for diabetes.  More often and at a younger age if you are overweight or have a high risk for diabetes. What should I know about preventing infection? Hepatitis B If you have a higher risk for hepatitis B, you should be screened for this virus. Talk with your health care provider to find out if you are at risk for hepatitis B infection. Hepatitis C Blood  testing is recommended for:  Everyone born from [birth year] through 1965.  Anyone with known risk factors for hepatitis C. Sexually transmitted infections (STIs)  You should be screened each year for STIs, including gonorrhea and chlamydia, if: ? You are sexually active and are younger than 65 years of age. ? You are older than 65 years of age and your health care provider tells you that you are at risk for this type of infection. ? Your sexual activity has changed since you were last screened, and you are at increased risk for chlamydia or gonorrhea. Ask your health care provider if you are at risk.  Ask your health care provider about whether you are at high risk for HIV. Your health care provider may recommend a prescription medicine to help prevent HIV infection. If you choose to take medicine to prevent HIV, you should first get tested for HIV. You should then be tested every 3 months for as long as you are taking the medicine. Follow these instructions at home: Lifestyle  Do not use any products that contain nicotine or tobacco, such as cigarettes, e-cigarettes, and chewing tobacco. If you need help quitting, ask your health care provider.  Do not use street drugs.  Do not share needles.  Ask your health care provider for help if you need support or information about quitting drugs. Alcohol use  Do not drink alcohol if your health care provider tells you not to drink.  If you drink alcohol: ? Limit how much you have to 0-2 drinks a day. ? Be aware of how much alcohol is in your drink. In the [place] one drink equals one 12 oz bottle of beer (355 mL), one 5 oz glass of wine (148 mL), or one 1 oz glass of hard liquor (44 mL). General instructions  Schedule regular health, dental, and eye exams.  Stay current with your vaccines.  Tell your health care provider if: ? You often feel depressed. ? You have ever been abused or do not feel safe at home. Summary  Adopting a healthy  lifestyle and getting preventive care are important in promoting health and wellness.  Follow your health care provider's instructions about healthy diet, exercising, and getting tested or screened for diseases.  Follow your health care provider's instructions on monitoring your cholesterol and blood pressure. This information is not intended to replace advice given to you by your health care provider. Make sure you discuss any questions you have with your health care provider. Document Released: 12/23/2007 Document Revised: 06/19/2018 Document Reviewed: 06/19/2018 Elsevier Patient Education  2020 [employer].

## 2019-02-04 ENCOUNTER — Other Ambulatory Visit (INDEPENDENT_AMBULATORY_CARE_PROVIDER_SITE_OTHER): Payer: BC Managed Care – PPO

## 2019-02-04 ENCOUNTER — Other Ambulatory Visit: Payer: Self-pay

## 2019-02-04 ENCOUNTER — Encounter: Payer: Self-pay | Admitting: Internal Medicine

## 2019-02-04 ENCOUNTER — Ambulatory Visit (INDEPENDENT_AMBULATORY_CARE_PROVIDER_SITE_OTHER): Payer: BC Managed Care – PPO | Admitting: Internal Medicine

## 2019-02-04 VITALS — BP 128/68 | HR 63 | Temp 98.3°F | Resp 16 | Ht 72.0 in | Wt 189.0 lb

## 2019-02-04 DIAGNOSIS — E782 Mixed hyperlipidemia: Secondary | ICD-10-CM | POA: Diagnosis not present

## 2019-02-04 DIAGNOSIS — Z0000 Encounter for general adult medical examination without abnormal findings: Secondary | ICD-10-CM

## 2019-02-04 DIAGNOSIS — E119 Type 2 diabetes mellitus without complications: Secondary | ICD-10-CM | POA: Diagnosis not present

## 2019-02-04 DIAGNOSIS — I10 Essential (primary) hypertension: Secondary | ICD-10-CM

## 2019-02-04 DIAGNOSIS — J449 Chronic obstructive pulmonary disease, unspecified: Secondary | ICD-10-CM

## 2019-02-04 DIAGNOSIS — F411 Generalized anxiety disorder: Secondary | ICD-10-CM

## 2019-02-04 LAB — CBC WITH DIFFERENTIAL/PLATELET
Basophils Absolute: 0.1 10*3/uL (ref 0.0–0.1)
Basophils Relative: 1.4 % (ref 0.0–3.0)
Eosinophils Absolute: 0.3 10*3/uL (ref 0.0–0.7)
Eosinophils Relative: 3.1 % (ref 0.0–5.0)
HCT: 41.4 % (ref 39.0–52.0)
Hemoglobin: 13.7 g/dL (ref 13.0–17.0)
Lymphocytes Relative: 17.7 % (ref 12.0–46.0)
Lymphs Abs: 1.5 10*3/uL (ref 0.7–4.0)
MCHC: 33.2 g/dL (ref 30.0–36.0)
MCV: 87.7 fl (ref 78.0–100.0)
Monocytes Absolute: 0.5 10*3/uL (ref 0.1–1.0)
Monocytes Relative: 6.2 % (ref 3.0–12.0)
Neutro Abs: 5.9 10*3/uL (ref 1.4–7.7)
Neutrophils Relative %: 71.6 % (ref 43.0–77.0)
Platelets: 371 10*3/uL (ref 150.0–400.0)
RBC: 4.71 Mil/uL (ref 4.22–5.81)
RDW: 17 % — ABNORMAL HIGH (ref 11.5–15.5)
WBC: 8.2 10*3/uL (ref 4.0–10.5)

## 2019-02-04 LAB — COMPREHENSIVE METABOLIC PANEL
ALT: 9 U/L (ref 0–53)
AST: 12 U/L (ref 0–37)
Albumin: 4.3 g/dL (ref 3.5–5.2)
Alkaline Phosphatase: 59 U/L (ref 39–117)
BUN: 22 mg/dL (ref 6–23)
CO2: 28 mEq/L (ref 19–32)
Calcium: 9.4 mg/dL (ref 8.4–10.5)
Chloride: 105 mEq/L (ref 96–112)
Creatinine, Ser: 1.32 mg/dL (ref 0.40–1.50)
GFR: 54.45 mL/min — ABNORMAL LOW (ref >60.00)
Glucose, Bld: 123 mg/dL — ABNORMAL HIGH (ref 70–99)
Potassium: 4.6 mEq/L (ref 3.5–5.1)
Sodium: 140 mEq/L (ref 135–145)
Total Bilirubin: 0.7 mg/dL (ref 0.2–1.2)
Total Protein: 6.7 g/dL (ref 6.0–8.3)

## 2019-02-04 LAB — LIPID PANEL
Cholesterol: 104 mg/dL (ref 0–200)
HDL: 30.8 mg/dL — ABNORMAL LOW (ref >39.00)
LDL Cholesterol: 40 mg/dL (ref 0–99)
NonHDL: 73.6
Total CHOL/HDL Ratio: 3
Triglycerides: 166 mg/dL — ABNORMAL HIGH (ref 0.0–149.0)
VLDL: 33.2 mg/dL (ref 0.0–40.0)

## 2019-02-04 LAB — TSH: TSH: 3.36 u[IU]/mL (ref 0.35–4.50)

## 2019-02-04 LAB — HEMOGLOBIN A1C: Hgb A1c MFr Bld: 6.4 % (ref 4.6–6.5)

## 2019-02-04 MED ORDER — SERTRALINE HCL 100 MG PO TABS: ORAL_TABLET | ORAL | 1 refills | Status: DC

## 2019-02-04 MED ORDER — METFORMIN HCL 500 MG PO TABS: ORAL_TABLET | ORAL | 3 refills | Status: DC

## 2019-02-04 MED ORDER — ATORVASTATIN CALCIUM 20 MG PO TABS: 20.0000 mg | ORAL_TABLET | Freq: Every day | ORAL | 3 refills | Status: DC

## 2019-02-04 NOTE — Assessment & Plan Note (Signed)
Controlled, stable Continue current dose of medication - sertraline 150 mg daily States he is no longer taking ativan prn

## 2019-02-04 NOTE — Assessment & Plan Note (Signed)
Still smoking, but has decreased amount Has SOB with exertion Continue advair Encouraged smoking cessation

## 2019-02-04 NOTE — Assessment & Plan Note (Signed)
BP Readings from Last 3 Encounters:  02/04/19 128/68  04/17/18 (!) 105/59  01/17/18 112/60   BP well controlled Current regimen effective and well tolerated Continue current medications at current doses cmp

## 2019-02-04 NOTE — Assessment & Plan Note (Signed)
Check a1c Low sugar / carb diet Stressed regular exercise Advised yearly eye exams Continue metformin

## 2019-02-04 NOTE — Assessment & Plan Note (Signed)
Check lipid panel,cmp ,tsh Continue daily statin Regular exercise and healthy diet encouraged  

## 2019-02-05 LAB — PSA, TOTAL AND FREE
PSA, % Free: 21 % (calc) — ABNORMAL LOW (ref >25)
PSA, Free: 0.3 ng/mL
PSA, Total: 1.4 ng/mL (ref <=4.0)

## 2019-02-06 ENCOUNTER — Encounter: Payer: Self-pay | Admitting: Internal Medicine

## 2019-04-30 ENCOUNTER — Other Ambulatory Visit: Payer: Self-pay | Admitting: Internal Medicine

## 2019-06-21 ENCOUNTER — Other Ambulatory Visit: Payer: Self-pay | Admitting: Internal Medicine

## 2019-08-18 LAB — HM DIABETES EYE EXAM

## 2019-09-23 ENCOUNTER — Other Ambulatory Visit: Payer: Self-pay | Admitting: Internal Medicine

## 2019-12-29 ENCOUNTER — Other Ambulatory Visit: Payer: Self-pay | Admitting: Internal Medicine

## 2020-01-26 ENCOUNTER — Other Ambulatory Visit: Payer: Self-pay | Admitting: Internal Medicine

## 2020-02-04 NOTE — Patient Instructions (Addendum)
Blood work was ordered.    All other Health Maintenance issues reviewed.   All recommended immunizations and age-appropriate screenings are up-to-date or discussed.  No immunization administered today.   Medications reviewed and updated.  Changes include :   none  Your prescription(s) have been submitted to your pharmacy. Please take as directed and contact our office if you believe you are having problem(s) with the medication(s).    Please followup in 6 months    Health Maintenance, Male Adopting a healthy lifestyle and getting preventive care are important in promoting health and wellness. Ask your health care provider about:  The right schedule for you to have regular tests and exams.  Things you can do on your own to prevent diseases and keep yourself healthy. What should I know about diet, weight, and exercise? Eat a healthy diet   Eat a diet that includes plenty of vegetables, fruits, low-fat dairy products, and lean protein.  Do not eat a lot of foods that are high in solid fats, added sugars, or sodium. Maintain a healthy weight Body mass index (BMI) is a measurement that can be used to identify possible weight problems. It estimates body fat based on height and weight. Your health care provider can help determine your BMI and help you achieve or maintain a healthy weight. Get regular exercise Get regular exercise. This is one of the most important things you can do for your health. Most adults should:  Exercise for at least 150 minutes each week. The exercise should increase your heart rate and make you sweat (moderate-intensity exercise).  Do strengthening exercises at least twice a week. This is in addition to the moderate-intensity exercise.  Spend less time sitting. Even light physical activity can be beneficial. Watch cholesterol and blood lipids Have your blood tested for lipids and cholesterol at 66 years of age, then have this test every 5 years. You may  need to have your cholesterol levels checked more often if:  Your lipid or cholesterol levels are high.  You are older than 66 years of age.  You are at high risk for heart disease. What should I know about cancer screening? Many types of cancers can be detected early and may often be prevented. Depending on your health history and family history, you may need to have cancer screening at various ages. This may include screening for:  Colorectal cancer.  Prostate cancer.  Skin cancer.  Lung cancer. What should I know about heart disease, diabetes, and high blood pressure? Blood pressure and heart disease  High blood pressure causes heart disease and increases the risk of stroke. This is more likely to develop in people who have high blood pressure readings, are of African descent, or are overweight.  Talk with your health care provider about your target blood pressure readings.  Have your blood pressure checked: ? Every 3-5 years if you are 18-39 years of age. ? Every year if you are 40 years old or older.  If you are between the ages of 65 and 75 and are a current or former smoker, ask your health care provider if you should have a one-time screening for abdominal aortic aneurysm (AAA). Diabetes Have regular diabetes screenings. This checks your fasting blood sugar level. Have the screening done:  Once every three years after age 45 if you are at a normal weight and have a low risk for diabetes.  More often and at a younger age if you are overweight or have a   a high risk for diabetes. What should I know about preventing infection? Hepatitis B If you have a higher risk for hepatitis B, you should be screened for this virus. Talk with your health care provider to find out if you are at risk for hepatitis B infection. Hepatitis C Blood testing is recommended for:  Everyone born from 1945 through 1965.  Anyone with known risk factors for hepatitis C. Sexually transmitted  infections (STIs)  You should be screened each year for STIs, including gonorrhea and chlamydia, if: ? You are sexually active and are younger than 66 years of age. ? You are older than 66 years of age and your health care provider tells you that you are at risk for this type of infection. ? Your sexual activity has changed since you were last screened, and you are at increased risk for chlamydia or gonorrhea. Ask your health care provider if you are at risk.  Ask your health care provider about whether you are at high risk for HIV. Your health care provider may recommend a prescription medicine to help prevent HIV infection. If you choose to take medicine to prevent HIV, you should first get tested for HIV. You should then be tested every 3 months for as long as you are taking the medicine. Follow these instructions at home: Lifestyle  Do not use any products that contain nicotine or tobacco, such as cigarettes, e-cigarettes, and chewing tobacco. If you need help quitting, ask your health care provider.  Do not use street drugs.  Do not share needles.  Ask your health care provider for help if you need support or information about quitting drugs. Alcohol use  Do not drink alcohol if your health care provider tells you not to drink.  If you drink alcohol: ? Limit how much you have to 0-2 drinks a day. ? Be aware of how much alcohol is in your drink. In the [place] one drink equals one 12 oz bottle of beer (355 mL), one 5 oz glass of wine (148 mL), or one 1 oz glass of hard liquor (44 mL). General instructions  Schedule regular health, dental, and eye exams.  Stay current with your vaccines.  Tell your health care provider if: ? You often feel depressed. ? You have ever been abused or do not feel safe at home. Summary  Adopting a healthy lifestyle and getting preventive care are important in promoting health and wellness.  Follow your health care provider's instructions about  healthy diet, exercising, and getting tested or screened for diseases.  Follow your health care provider's instructions on monitoring your cholesterol and blood pressure. This information is not intended to replace advice given to you by your health care provider. Make sure you discuss any questions you have with your health care provider. Document Revised: 06/19/2018 Document Reviewed: 06/19/2018 Elsevier Patient Education  2020 [employer]  

## 2020-02-04 NOTE — Progress Notes (Signed)
Subjective:    Patient ID: Shane Adams, male    DOB: 1955-08-26, 64 y.o.   MRN: [id number]  HPI He is here for a physical exam.   He denies any changes in his health and has no questions.   Medications and allergies reviewed with patient and updated if appropriate.  Patient Active Problem List   Diagnosis Date Noted  . Family history of alpha 1 antitrypsin deficiency 01/17/2018  . Chronic obstructive pulmonary disease ([place]) 01/17/2018  . Coronary artery calcification seen on CT scan 02/09/2017  . Right knee pain 06/26/2016  . Obstructive sleep apnea 01/07/2013  . SMOKER 10/01/2009  . NEPHROLITHIASIS, HX OF 10/01/2009  . Diabetes ([place]) 09/24/2008  . History of colonic polyps 09/24/2008  . ERECTILE DYSFUNCTION 01/24/2008  . CARPAL TUNNEL SYNDROME 01/24/2008  . Anxiety state 05/31/2007  . BPH (benign prostatic hyperplasia) 05/31/2007  . HYPERLIPIDEMIA 05/09/2007  . Essential hypertension 05/09/2007  . 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 aspirin EC 81 MG tablet Take 1 tablet (81 mg total) by mouth daily.    Marland Kitchen atorvastatin (LIPITOR) 20 MG tablet Take 1 tablet (20 mg total) by mouth daily. 90 tablet 3  . Fluticasone-Salmeterol (ADVAIR) 100-50 MCG/DOSE AEPB INHALE 1 DOSE BY MOUTH EVERY 12 HOURS 60 each 0  . lisinopril (ZESTRIL) 10 MG tablet Take 1 tablet by mouth once daily Annual appt is due must see provider for future refills 30 tablet 0  . metFORMIN (GLUCOPHAGE) 500 MG tablet 1/2 tablet daily with largest meal 45 tablet 3  . Multiple Vitamin (MULTIVITAMIN) tablet Take 1 tablet by mouth daily.    . nicotine (NICODERM CQ - DOSED IN MG/24 HR) 7 mg/24hr patch Place 7 mg onto the skin daily.    . sertraline (ZOLOFT) 100 MG tablet TAKE 1 & 1/2 (ONE & ONE-HALF) TABLETS BY MOUTH ONCE DAILY 135 tablet 1   No current facility-administered medications on file prior to visit.    Past Medical  History:  Diagnosis Date  . Anxiety   . Arthritis   . 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 Diabetes mellitus   . Emphysema of lung ([place])   . GERD (gastroesophageal reflux disease)   . Gilbert's syndrome   . Hyperlipidemia   . Hypertension   . Kidney calculi 1976   passed spontaneously    Past Surgical History:  Procedure Laterality Date  . CARDIAC CATHETERIZATION  2004   mild CAD  . COLONOSCOPY W/ POLYPECTOMY  2007 & 2012   Dr Henrene Pastor  . TONSILLECTOMY  1984  . UVULECTOMY  1984   for sleep apnea     Social History   Socioeconomic History  . Marital status: Married    Spouse name: Not on file  . Number of children: Not on file  . Years of education: Not on file  . Highest education level: Not on file  Occupational History  . Not on file  Tobacco Use  . Smoking status: Current Every Day Smoker    Packs/day: 0.25    Types: Cigarettes  . Smokeless tobacco: Never Used  . Tobacco comment: < 1 ppd ( smoked 1973-present , up to 2 ppd); see Problem List  Vaping Use  . Vaping Use: Never used  Substance and Sexual Activity  . Alcohol use: No  . Drug use: No  . Sexual activity: Not on  file  Other Topics Concern  . Not on file  Social History Narrative  . Not on file   Social Determinants of Health   Financial Resource Strain:   . Difficulty of Paying Living Expenses:   Food Insecurity:   . Worried About Charity fundraiser in the Last Year:   . Arboriculturist in the Last Year:   Transportation Needs:   . Film/video editor (Medical):   Marland Kitchen Lack of Transportation (Non-Medical):   Physical Activity:   . Days of Exercise per Week:   . Minutes of Exercise per Session:   Stress:   . Feeling of Stress :   Social Connections:   . Frequency of Communication with Friends and Family:   . Frequency of Social Gatherings with Friends and Family:   . Attends Religious Services:   . Active Member of Clubs or Organizations:   . Attends  Archivist Meetings:   Marland Kitchen Marital Status:     Family History  Problem Relation Age of Onset  . Prostate cancer Father 56  . Dysrhythmia Father   . Hypertension Mother   . Diabetes Mother   . Alzheimer's disease Mother   . Heart attack Maternal Uncle 55  . Alzheimer's disease Maternal Aunt   . Cancer Paternal Aunt        ? primary  . Heart attack Maternal Aunt        ? > 65  . Stroke Neg Hx   . Colon cancer Neg Hx   . Colon polyps Neg Hx   . Esophageal cancer Neg Hx   . Stomach cancer Neg Hx   . Rectal cancer Neg Hx     Review of Systems  Constitutional: Negative for chills, fatigue and fever.  Eyes: Negative for visual disturbance.  Respiratory: Positive for wheezing. Negative for cough and shortness of breath.   Cardiovascular: Negative for chest pain, palpitations and leg swelling.  Gastrointestinal: Negative for abdominal pain, blood in stool, constipation, diarrhea and nausea.       No gerd  Genitourinary: Negative for dysuria and hematuria.  Musculoskeletal: Negative for arthralgias and back pain.  Skin: Negative for color change and rash.  Neurological: Negative for light-headedness and headaches.  Psychiatric/Behavioral: Negative for dysphoric mood. The patient is not nervous/anxious.        Objective:   Vitals:   02/05/20 0746  BP: 126/68  Pulse: 60  Temp: 98 F (36.7 C)  SpO2: 96%   Filed Weights   02/05/20 0746  Weight: 178 lb (80.7 kg)   Body mass index is 24.14 kg/m.  BP Readings from Last 3 Encounters:  02/05/20 126/68  02/04/19 128/68  04/17/18 (!) 105/59    Wt Readings from Last 3 Encounters:  02/05/20 178 lb (80.7 kg)  02/04/19 189 lb (85.7 kg)  04/17/18 191 lb (86.6 kg)     Physical Exam Constitutional: He appears well-developed and well-nourished. No distress.  HENT:  Head: Normocephalic and atraumatic.  Right Ear: External ear normal.  Left Ear: External ear normal.  Mouth/Throat: Oropharynx is clear and moist.   Normal ear canals and TM b/l  Eyes: Conjunctivae and EOM are normal.  Neck: Neck supple. No tracheal deviation present. No thyromegaly present.  No carotid bruit  Cardiovascular: Normal rate, regular rhythm, normal heart sounds and intact distal pulses.   No murmur heard. Pulmonary/Chest: Effort normal and breath sounds normal. No respiratory distress. He has no wheezes. He has no rales.  Abdominal: Soft. He exhibits no distension. There is no tenderness.  Genitourinary: deferred  Musculoskeletal: He exhibits no edema.  Lymphadenopathy:   He has no cervical adenopathy.  Skin: Skin is warm and dry. He is not diaphoretic.  Psychiatric: He has a normal mood and affect. His behavior is normal.         Assessment & Plan:   Physical exam: Screening blood work  ordered Immunizations  Had covid,  prevnar discussed , discussed shingrix -deferred both Colonoscopy   Up to date  Eye exams   Up to date - Dr Gilford Rile in [place] Exercise   Active Weight  normal Substance abuse   Smoker - 3-4 cig/day - not really ready to quit - encouraged quitting  See Problem List for Assessment and Plan of chronic medical problems.   This visit occurred during the SARS-CoV-2 public health emergency.  Safety protocols were in place, including screening questions prior to the visit, additional usage of staff PPE, and extensive cleaning of exam room while observing appropriate contact time as indicated for disinfecting solutions.

## 2020-02-05 ENCOUNTER — Encounter: Payer: Self-pay | Admitting: Internal Medicine

## 2020-02-05 ENCOUNTER — Other Ambulatory Visit: Payer: Self-pay

## 2020-02-05 ENCOUNTER — Ambulatory Visit (INDEPENDENT_AMBULATORY_CARE_PROVIDER_SITE_OTHER): Payer: Medicare PPO | Admitting: Internal Medicine

## 2020-02-05 VITALS — BP 126/68 | HR 60 | Temp 98.0°F | Ht 72.0 in | Wt 178.0 lb

## 2020-02-05 DIAGNOSIS — F411 Generalized anxiety disorder: Secondary | ICD-10-CM

## 2020-02-05 DIAGNOSIS — J449 Chronic obstructive pulmonary disease, unspecified: Secondary | ICD-10-CM

## 2020-02-05 DIAGNOSIS — E782 Mixed hyperlipidemia: Secondary | ICD-10-CM

## 2020-02-05 DIAGNOSIS — E119 Type 2 diabetes mellitus without complications: Secondary | ICD-10-CM | POA: Diagnosis not present

## 2020-02-05 DIAGNOSIS — Z0000 Encounter for general adult medical examination without abnormal findings: Secondary | ICD-10-CM

## 2020-02-05 DIAGNOSIS — Z125 Encounter for screening for malignant neoplasm of prostate: Secondary | ICD-10-CM | POA: Diagnosis not present

## 2020-02-05 DIAGNOSIS — I10 Essential (primary) hypertension: Secondary | ICD-10-CM | POA: Diagnosis not present

## 2020-02-05 LAB — PSA, MEDICARE: PSA: 2.02 ng/ml (ref 0.10–4.00)

## 2020-02-05 MED ORDER — ATORVASTATIN CALCIUM 20 MG PO TABS: 20.0000 mg | ORAL_TABLET | Freq: Every day | ORAL | 3 refills | Status: DC

## 2020-02-05 MED ORDER — METFORMIN HCL 500 MG PO TABS: ORAL_TABLET | ORAL | 3 refills | Status: DC

## 2020-02-05 MED ORDER — FLUTICASONE-SALMETEROL 100-50 MCG/DOSE IN AEPB: INHALATION_SPRAY | RESPIRATORY_TRACT | 11 refills | Status: DC

## 2020-02-05 MED ORDER — SERTRALINE HCL 100 MG PO TABS: ORAL_TABLET | ORAL | 3 refills | Status: DC

## 2020-02-05 MED ORDER — LISINOPRIL 10 MG PO TABS: ORAL_TABLET | ORAL | 3 refills | Status: DC

## 2020-02-05 NOTE — Assessment & Plan Note (Signed)
Chronic Intermittent wheezing, no DOE Still smoking Takes advair prn only Encouraged smoking cessation Continue advair prn

## 2020-02-05 NOTE — Assessment & Plan Note (Signed)
Chronic Check lipid panel  Continue daily statin Regular exercise and healthy diet encouraged  

## 2020-02-05 NOTE — Assessment & Plan Note (Signed)
Chronic Check a1c Low sugar / carb diet continue regular exercise Continue metformin

## 2020-02-05 NOTE — Assessment & Plan Note (Signed)
Chronic Controlled, stable Continue current dose of medication Sertraline 150 mg daily

## 2020-02-05 NOTE — Assessment & Plan Note (Signed)
Chronic BP well controlled Current regimen effective and well tolerated Continue current medications at current doses cmp  

## 2020-02-06 LAB — CBC WITH DIFFERENTIAL/PLATELET
Absolute Monocytes: 585 cells/uL (ref 200–950)
Basophils Absolute: 189 cells/uL (ref 0–200)
Basophils Relative: 2.2 %
Eosinophils Absolute: 215 cells/uL (ref 15–500)
Eosinophils Relative: 2.5 %
HCT: 44.3 % (ref 38.5–50.0)
Hemoglobin: 14.3 g/dL (ref 13.2–17.1)
Lymphs Abs: 1574 cells/uL (ref 850–3900)
MCH: 28.3 pg (ref 27.0–33.0)
MCHC: 32.3 g/dL (ref 32.0–36.0)
MCV: 87.7 fL (ref 80.0–100.0)
MPV: 11.4 fL (ref 7.5–12.5)
Monocytes Relative: 6.8 %
Neutro Abs: 6037 cells/uL (ref 1500–7800)
Neutrophils Relative %: 70.2 %
Platelets: 503 10*3/uL — ABNORMAL HIGH (ref 140–400)
RBC: 5.05 10*6/uL (ref 4.20–5.80)
RDW: 15.7 % — ABNORMAL HIGH (ref 11.0–15.0)
Total Lymphocyte: 18.3 %
WBC: 8.6 10*3/uL (ref 3.8–10.8)

## 2020-02-06 LAB — COMPREHENSIVE METABOLIC PANEL
AG Ratio: 2 (calc) (ref 1.0–2.5)
ALT: 9 U/L (ref 9–46)
AST: 12 U/L (ref 10–35)
Albumin: 4.3 g/dL (ref 3.6–5.1)
Alkaline phosphatase (APISO): 71 U/L (ref 35–144)
BUN/Creatinine Ratio: 13 (calc) (ref 6–22)
BUN: 19 mg/dL (ref 7–25)
CO2: 27 mmol/L (ref 20–32)
Calcium: 9.6 mg/dL (ref 8.6–10.3)
Chloride: 106 mmol/L (ref 98–110)
Creat: 1.51 mg/dL — ABNORMAL HIGH (ref 0.70–1.25)
Globulin: 2.2 g/dL (calc) (ref 1.9–3.7)
Glucose, Bld: 112 mg/dL — ABNORMAL HIGH (ref 65–99)
Potassium: 4.8 mmol/L (ref 3.5–5.3)
Sodium: 141 mmol/L (ref 135–146)
Total Bilirubin: 0.7 mg/dL (ref 0.2–1.2)
Total Protein: 6.5 g/dL (ref 6.1–8.1)

## 2020-02-06 LAB — LIPID PANEL
Cholesterol: 99 mg/dL (ref <200)
HDL: 33 mg/dL — ABNORMAL LOW (ref >=40)
LDL Cholesterol (Calc): 46 mg/dL (calc)
Non-HDL Cholesterol (Calc): 66 mg/dL (calc) (ref <130)
Total CHOL/HDL Ratio: 3 (calc) (ref <5.0)
Triglycerides: 110 mg/dL (ref <150)

## 2020-02-06 LAB — TSH: TSH: 2.93 mIU/L (ref 0.40–4.50)

## 2020-02-06 LAB — HEMOGLOBIN A1C
Hgb A1c MFr Bld: 6 % of total Hgb — ABNORMAL HIGH (ref <5.7)
Mean Plasma Glucose: 126 (calc)
eAG (mmol/L): 7 (calc)

## 2020-03-31 ENCOUNTER — Encounter: Payer: Self-pay | Admitting: Internal Medicine

## 2020-03-31 NOTE — Progress Notes (Signed)
Outside notes received. Information abstracted. Notes sent to scan.  

## 2020-04-30 DIAGNOSIS — Z23 Encounter for immunization: Secondary | ICD-10-CM | POA: Diagnosis not present

## 2020-10-21 DIAGNOSIS — Z23 Encounter for immunization: Secondary | ICD-10-CM | POA: Diagnosis not present

## 2021-02-07 ENCOUNTER — Encounter: Payer: Self-pay | Admitting: Internal Medicine

## 2021-02-07 NOTE — Progress Notes (Signed)
Subjective:    Patient ID: Shane Adams, male    DOB: Aug 22, 1955, 65 y.o.   MRN: [id number]   This visit occurred during the SARS-CoV-2 public health emergency.  Safety protocols were in place, including screening questions prior to the visit, additional usage of staff PPE, and extensive cleaning of exam room while observing appropriate contact time as indicated for disinfecting solutions.   HPI He is here for a physical exam.   He has lost weight over the past year - he is not eating as much - it is intentional.    Medications and allergies reviewed with patient and updated if appropriate.  Patient Active Problem List   Diagnosis Date Noted   Family history of alpha 1 antitrypsin deficiency 01/17/2018   Chronic obstructive pulmonary disease ([place]) 01/17/2018   Coronary artery calcification seen on CT scan 02/09/2017   Right knee pain 06/26/2016   Obstructive sleep apnea 01/07/2013   SMOKER 10/01/2009   NEPHROLITHIASIS, HX OF 10/01/2009   Diabetes ([place]) 09/24/2008   History of colonic polyps 09/24/2008   ERECTILE DYSFUNCTION 01/24/2008   CARPAL TUNNEL SYNDROME 01/24/2008   Anxiety state 05/31/2007   BPH (benign prostatic hyperplasia) 05/31/2007   HYPERLIPIDEMIA 05/09/2007   Essential hypertension 05/09/2007   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     atorvastatin (LIPITOR) 20 MG tablet Take 1 tablet (20 mg total) by mouth daily. 90 tablet 3   Fluticasone-Salmeterol (ADVAIR) 100-50 MCG/DOSE AEPB INHALE 1 DOSE BY MOUTH EVERY 12 HOURS 60 each 11   lisinopril (ZESTRIL) 10 MG tablet Take 1 tablet by mouth once daily 90 tablet 3   metFORMIN (GLUCOPHAGE) 500 MG tablet 1/2 tablet daily with largest meal 45 tablet 3   Multiple Vitamin (MULTIVITAMIN) tablet Take 1 tablet by mouth daily.     No current facility-administered  medications on file prior to visit.    Past Medical History:  Diagnosis Date   Anxiety    Arthritis    COPD (chronic obstructive pulmonary disease) ([place])    RAD  component   Diabetes mellitus    Emphysema of lung ([place])    GERD (gastroesophageal reflux disease)    Gilbert's syndrome    Hyperlipidemia    Hypertension    Kidney calculi 1976   passed spontaneously    Past Surgical History:  Procedure Laterality Date   CARDIAC CATHETERIZATION  2004   mild CAD   COLONOSCOPY W/ POLYPECTOMY  2007 & 2012   Dr Henrene Pastor   TONSILLECTOMY  1984   UVULECTOMY  1984   for sleep apnea     Social History   Socioeconomic History   Marital status: Married    Spouse name: Not on file   Number of children: Not on file   Years of education: Not on file   Highest education level: Not on file  Occupational History   Not on file  Tobacco Use   Smoking status: Every Day    Packs/day: 0.25    Types: Cigarettes   Smokeless tobacco: Never   Tobacco comments:    < 1 ppd ( smoked 1973-present , up to 2 ppd); see Problem List  Vaping Use   Vaping Use: Never used  Substance and Sexual Activity   Alcohol use: No   Drug use: No   Sexual activity: Not  on file  Other Topics Concern   Not on file  Social History Narrative   Not on file   Social Determinants of Health   Financial Resource Strain: Not on file  Food Insecurity: Not on file  Transportation Needs: Not on file  Physical Activity: Not on file  Stress: Not on file  Social Connections: Not on file    Family History  Problem Relation Age of Onset   Prostate cancer Father 62   Dysrhythmia Father    Hypertension Mother    Diabetes Mother    Alzheimer's disease Mother    Heart attack Maternal Uncle 75   Alzheimer's disease Maternal Aunt    Cancer Paternal Aunt        ? primary   Heart attack Maternal Aunt        ? > 65   Stroke Neg Hx    Colon cancer Neg Hx    Colon polyps Neg Hx    Esophageal cancer Neg Hx    Stomach  cancer Neg Hx    Rectal cancer Neg Hx     Review of Systems  Constitutional:  Negative for chills and fever.  Eyes:  Negative for visual disturbance.  Respiratory:  Positive for cough (coughs 2-3 a day - occ brings up phlegm), chest tightness (occ), shortness of breath (sometimes with humidity) and wheezing (occ).   Cardiovascular:  Negative for chest pain, palpitations and leg swelling.  Gastrointestinal:  Negative for abdominal pain, blood in stool, constipation, diarrhea and nausea.       Occ gerd  Genitourinary:  Negative for difficulty urinating and dysuria.  Musculoskeletal:  Positive for back pain (mild at times, depending on activities). Negative for arthralgias.  Skin:  Negative for color change and rash.  Neurological:  Negative for light-headedness and headaches.  Psychiatric/Behavioral:  Negative for dysphoric mood. The patient is not nervous/anxious.       Objective:   Vitals:   02/08/21 0748  BP: 126/70  Pulse: 70  Temp: 98.4 F (36.9 C)  SpO2: 99%   Filed Weights   02/08/21 0748  Weight: 161 lb 12.8 oz (73.4 kg)   Body mass index is 21.94 kg/m.  BP Readings from Last 3 Encounters:  02/08/21 126/70  02/05/20 126/68  02/04/19 128/68    Wt Readings from Last 3 Encounters:  02/08/21 161 lb 12.8 oz (73.4 kg)  02/05/20 178 lb (80.7 kg)  02/04/19 189 lb (85.7 kg)    Depression screen [facility] 2/9 02/08/2021 02/05/2020 01/17/2018 06/26/2016  Decreased Interest 0 0 0 0  Down, Depressed, Hopeless 0 0 0 0  PHQ - 2 Score 0 0 0 0  Altered sleeping 0 - - -  Tired, decreased energy 0 - - -  Change in appetite 0 - - -  Feeling bad or failure about yourself  0 - - -  Trouble concentrating 0 - - -  Moving slowly or fidgety/restless 0 - - -  Suicidal thoughts 0 - - -  PHQ-9 Score 0 - - -    GAD 7 : Generalized Anxiety Score 02/08/2021  Nervous, Anxious, on Edge 0  Control/stop worrying 0  Worry too much - different things 0  Trouble relaxing 0  Restless 0  Easily  annoyed or irritable 0  Afraid - awful might happen 0  Total GAD 7 Score 0       Physical Exam Constitutional: He appears well-developed and well-nourished. No distress.  HENT:  Head: Normocephalic and atraumatic.  Right Ear: External ear normal.  Left Ear: External ear normal.  Mouth/Throat: Oropharynx is clear and moist.  Normal ear canals and TM b/l  Eyes: Conjunctivae and EOM are normal.  Neck: Neck supple. No tracheal deviation present. No thyromegaly present.  No carotid bruit  Cardiovascular: Normal rate, regular rhythm, normal heart sounds and intact distal pulses.   No murmur heard. Pulmonary/Chest: Effort normal and breath sounds normal. No respiratory distress. He has no wheezes. He has no rales.  Abdominal: Soft. He exhibits no distension. There is no tenderness.  Genitourinary: deferred  Musculoskeletal: He exhibits no edema.  Lymphadenopathy:   He has no cervical adenopathy.  Skin: Skin is warm and dry. He is not diaphoretic.  Psychiatric: He has a normal mood and affect. His behavior is normal.         Assessment & Plan:   Physical exam: Screening blood work  ordered Exercise   walking, lots of yard work Weight  normal Substance abuse   none   Screened for depression using the PHQ 9 scale.  No evidence of depression.    Screened for anxiety using GAD7 Scale.  No evidence of anxiety.   Reviewed recommended immunizations.   Health Maintenance  Topic Date Due   COVID-19 Vaccine (1) Never done   Zoster Vaccines- Shingrix (1 of 2) Never done   PNA vac Low Risk Adult (1 of 2 - PCV13) 03/14/2019   FOOT EXAM  02/04/2020   OPHTHALMOLOGY EXAM  08/17/2020   INFLUENZA VACCINE  02/07/2021   HEMOGLOBIN A1C  08/11/2021   COLONOSCOPY (Pts 45-71yr Insurance coverage will need to be confirmed)  04/18/2023   TETANUS/TDAP  01/09/2024   Hepatitis C Screening  Completed   HPV VACCINES  Aged Out     See Problem List for Assessment and Plan of chronic medical  problems.

## 2021-02-07 NOTE — Patient Instructions (Addendum)
You should consider getting the new shingles vaccine at the pharmacy.   You should get the pneumonia vaccines.    Blood work was ordered.     Medications changes include :   start jardiance 10 mg daily and stop metformin  Your prescription(s) have been submitted to your pharmacy. Please take as directed and contact our office if you believe you are having problem(s) with the medication(s).   Please followup in 6 months    Health Maintenance, Male Adopting a healthy lifestyle and getting preventive care are important in promoting health and wellness. Ask your health care provider about: The right schedule for you to have regular tests and exams. Things you can do on your own to prevent diseases and keep yourself healthy. What should I know about diet, weight, and exercise? Eat a healthy diet  Eat a diet that includes plenty of vegetables, fruits, low-fat dairy products, and lean protein. Do not eat a lot of foods that are high in solid fats, added sugars, or sodium.  Maintain a healthy weight Body mass index (BMI) is a measurement that can be used to identify possible weight problems. It estimates body fat based on height and weight. Your health care provider can help determine your BMI and help you achieve or maintain ahealthy weight. Get regular exercise Get regular exercise. This is one of the most important things you can do for your health. Most adults should: Exercise for at least 150 minutes each week. The exercise should increase your heart rate and make you sweat (moderate-intensity exercise). Do strengthening exercises at least twice a week. This is in addition to the moderate-intensity exercise. Spend less time sitting. Even light physical activity can be beneficial. Watch cholesterol and blood lipids Have your blood tested for lipids and cholesterol at 67 years of age, then havethis test every 5 years. You may need to have your cholesterol levels checked more often  if: Your lipid or cholesterol levels are high. You are older than 67 years of age. You are at high risk for heart disease. What should I know about cancer screening? Many types of cancers can be detected early and may often be prevented. Depending on your health history and family history, you may need to have cancer screening at various ages. This may include screening for: Colorectal cancer. Prostate cancer. Skin cancer. Lung cancer. What should I know about heart disease, diabetes, and high blood pressure? Blood pressure and heart disease High blood pressure causes heart disease and increases the risk of stroke. This is more likely to develop in people who have high blood pressure readings, are of African descent, or are overweight. Talk with your health care provider about your target blood pressure readings. Have your blood pressure checked: Every 3-5 years if you are 30-68 years of age. Every year if you are 41 years old or older. If you are between the ages of 29 and 32 and are a current or former smoker, ask your health care provider if you should have a one-time screening for abdominal aortic aneurysm (AAA). Diabetes Have regular diabetes screenings. This checks your fasting blood sugar level. Have the screening done: Once every three years after age 21 if you are at a normal weight and have a low risk for diabetes. More often and at a younger age if you are overweight or have a high risk for diabetes. What should I know about preventing infection? Hepatitis B If you have a higher risk for hepatitis B, you  should be screened for this virus. Talk with your health care provider to find out if you are at risk forhepatitis B infection. Hepatitis C Blood testing is recommended for: Everyone born from [birth year] through 1967. Anyone with known risk factors for hepatitis C. Sexually transmitted infections (STIs) You should be screened each year for STIs, including gonorrhea and chlamydia,  if: You are sexually active and are younger than 67 years of age. You are older than 66 years of age and your health care provider tells you that you are at risk for this type of infection. Your sexual activity has changed since you were last screened, and you are at increased risk for chlamydia or gonorrhea. Ask your health care provider if you are at risk. Ask your health care provider about whether you are at high risk for HIV. Your health care provider may recommend a prescription medicine to help prevent HIV infection. If you choose to take medicine to prevent HIV, you should first get tested for HIV. You should then be tested every 3 months for as long as you are taking the medicine. Follow these instructions at home: Lifestyle Do not use any products that contain nicotine or tobacco, such as cigarettes, e-cigarettes, and chewing tobacco. If you need help quitting, ask your health care provider. Do not use street drugs. Do not share needles. Ask your health care provider for help if you need support or information about quitting drugs. Alcohol use Do not drink alcohol if your health care provider tells you not to drink. If you drink alcohol: Limit how much you have to 0-2 drinks a day. Be aware of how much alcohol is in your drink. In the [place] one drink equals one 12 oz bottle of beer (355 mL), one 5 oz glass of wine (148 mL), or one 1 oz glass of hard liquor (44 mL). General instructions Schedule regular health, dental, and eye exams. Stay current with your vaccines. Tell your health care provider if: You often feel depressed. You have ever been abused or do not feel safe at home. Summary Adopting a healthy lifestyle and getting preventive care are important in promoting health and wellness. Follow your health care provider's instructions about healthy diet, exercising, and getting tested or screened for diseases. Follow your health care provider's instructions on monitoring your  cholesterol and blood pressure. This information is not intended to replace advice given to you by your health care provider. Make sure you discuss any questions you have with your healthcare provider. Document Revised: 06/19/2018 Document Reviewed: 06/19/2018 Elsevier Patient Education  2022 [employer].

## 2021-02-08 ENCOUNTER — Other Ambulatory Visit: Payer: Self-pay

## 2021-02-08 ENCOUNTER — Ambulatory Visit (INDEPENDENT_AMBULATORY_CARE_PROVIDER_SITE_OTHER): Payer: Medicare PPO | Admitting: Internal Medicine

## 2021-02-08 VITALS — BP 126/70 | HR 70 | Temp 98.4°F | Ht 72.0 in | Wt 161.8 lb

## 2021-02-08 DIAGNOSIS — E119 Type 2 diabetes mellitus without complications: Secondary | ICD-10-CM

## 2021-02-08 DIAGNOSIS — N400 Enlarged prostate without lower urinary tract symptoms: Secondary | ICD-10-CM

## 2021-02-08 DIAGNOSIS — I10 Essential (primary) hypertension: Secondary | ICD-10-CM

## 2021-02-08 DIAGNOSIS — Z1331 Encounter for screening for depression: Secondary | ICD-10-CM | POA: Diagnosis not present

## 2021-02-08 DIAGNOSIS — J449 Chronic obstructive pulmonary disease, unspecified: Secondary | ICD-10-CM | POA: Diagnosis not present

## 2021-02-08 DIAGNOSIS — I2510 Atherosclerotic heart disease of native coronary artery without angina pectoris: Secondary | ICD-10-CM | POA: Diagnosis not present

## 2021-02-08 DIAGNOSIS — Z125 Encounter for screening for malignant neoplasm of prostate: Secondary | ICD-10-CM

## 2021-02-08 DIAGNOSIS — F411 Generalized anxiety disorder: Secondary | ICD-10-CM

## 2021-02-08 DIAGNOSIS — E782 Mixed hyperlipidemia: Secondary | ICD-10-CM

## 2021-02-08 DIAGNOSIS — Z0000 Encounter for general adult medical examination without abnormal findings: Secondary | ICD-10-CM

## 2021-02-08 DIAGNOSIS — D75839 Thrombocytosis, unspecified: Secondary | ICD-10-CM

## 2021-02-08 LAB — CBC WITH DIFFERENTIAL/PLATELET
Basophils Absolute: 0.2 10*3/uL — ABNORMAL HIGH (ref 0.0–0.1)
Basophils Relative: 1.9 % (ref 0.0–3.0)
Eosinophils Absolute: 0.4 10*3/uL (ref 0.0–0.7)
Eosinophils Relative: 3.7 % (ref 0.0–5.0)
HCT: 43.2 % (ref 39.0–52.0)
Hemoglobin: 14.3 g/dL (ref 13.0–17.0)
Lymphocytes Relative: 15.7 % (ref 12.0–46.0)
Lymphs Abs: 1.7 10*3/uL (ref 0.7–4.0)
MCHC: 33 g/dL (ref 30.0–36.0)
MCV: 86.8 fl (ref 78.0–100.0)
Monocytes Absolute: 0.6 10*3/uL (ref 0.1–1.0)
Monocytes Relative: 5.2 % (ref 3.0–12.0)
Neutro Abs: 7.9 10*3/uL — ABNORMAL HIGH (ref 1.4–7.7)
Neutrophils Relative %: 73.5 % (ref 43.0–77.0)
Platelets: 631 10*3/uL — ABNORMAL HIGH (ref 150.0–400.0)
RBC: 4.97 Mil/uL (ref 4.22–5.81)
RDW: 18 % — ABNORMAL HIGH (ref 11.5–15.5)
WBC: 10.7 10*3/uL — ABNORMAL HIGH (ref 4.0–10.5)

## 2021-02-08 LAB — COMPREHENSIVE METABOLIC PANEL
ALT: 11 U/L (ref 0–53)
AST: 14 U/L (ref 0–37)
Albumin: 3.9 g/dL (ref 3.5–5.2)
Alkaline Phosphatase: 57 U/L (ref 39–117)
BUN: 19 mg/dL (ref 6–23)
CO2: 28 mEq/L (ref 19–32)
Calcium: 8.9 mg/dL (ref 8.4–10.5)
Chloride: 105 mEq/L (ref 96–112)
Creatinine, Ser: 1.24 mg/dL (ref 0.40–1.50)
GFR: 60.42 mL/min (ref >60.00)
Glucose, Bld: 93 mg/dL (ref 70–99)
Potassium: 4.3 mEq/L (ref 3.5–5.1)
Sodium: 141 mEq/L (ref 135–145)
Total Bilirubin: 0.8 mg/dL (ref 0.2–1.2)
Total Protein: 6.3 g/dL (ref 6.0–8.3)

## 2021-02-08 LAB — LIPID PANEL
Cholesterol: 91 mg/dL (ref 0–200)
HDL: 36.7 mg/dL — ABNORMAL LOW (ref >39.00)
LDL Cholesterol: 36 mg/dL (ref 0–99)
NonHDL: 53.82
Total CHOL/HDL Ratio: 2
Triglycerides: 88 mg/dL (ref 0.0–149.0)
VLDL: 17.6 mg/dL (ref 0.0–40.0)

## 2021-02-08 LAB — TSH: TSH: 3.09 u[IU]/mL (ref 0.35–5.50)

## 2021-02-08 LAB — HEMOGLOBIN A1C: Hgb A1c MFr Bld: 6.1 % (ref 4.6–6.5)

## 2021-02-08 MED ORDER — EMPAGLIFLOZIN 10 MG PO TABS: 10.0000 mg | ORAL_TABLET | Freq: Every day | ORAL | 3 refills | Status: DC

## 2021-02-08 NOTE — Assessment & Plan Note (Signed)
Chronic BP well controlled Continue lisinopril 10 mg qd cmp  

## 2021-02-08 NOTE — Assessment & Plan Note (Addendum)
Chronic No concerning angina like symptoms Continue ASA, statin BP well controlled Stressed smoking cessation

## 2021-02-08 NOTE — Assessment & Plan Note (Signed)
No longer taking sertraline and doing well w/o it Will monitor off medication

## 2021-02-08 NOTE — Assessment & Plan Note (Addendum)
Chronic Controlled a1c today GFR has been slightly decreased and high risk for CVA/CAD -- > D/c metformin 250 mg daily and start jardiance 10 mg daily  Stressed smoking cessation

## 2021-02-08 NOTE — Assessment & Plan Note (Signed)
Chronic Check lipid panel  Continue atorvastatin 20 mg qd Regular exercise and healthy diet encouraged

## 2021-02-08 NOTE — Assessment & Plan Note (Signed)
No obstructive symptoms

## 2021-02-08 NOTE — Assessment & Plan Note (Signed)
Chronic Taking advair 100-39mg daily prn - with chest tightness or wheezing - typically a few times a week - does not need it daily

## 2021-02-10 ENCOUNTER — Encounter: Payer: Self-pay | Admitting: Internal Medicine

## 2021-02-10 DIAGNOSIS — D75839 Thrombocytosis, unspecified: Secondary | ICD-10-CM | POA: Insufficient documentation

## 2021-02-10 MED ORDER — METFORMIN HCL 500 MG PO TABS: ORAL_TABLET | ORAL | 3 refills | Status: DC

## 2021-02-10 NOTE — Addendum Note (Signed)
Addended by: Binnie Rail on: 02/10/2021 07:29 AM   Modules accepted: Orders

## 2021-02-11 ENCOUNTER — Other Ambulatory Visit: Payer: Self-pay | Admitting: *Deleted

## 2021-02-11 ENCOUNTER — Telehealth: Payer: Self-pay

## 2021-02-11 DIAGNOSIS — D75839 Thrombocytosis, unspecified: Secondary | ICD-10-CM

## 2021-02-11 NOTE — Telephone Encounter (Signed)
Spoke with wife today. She is concerned that PSA was not collected at the time of visit on 02/08/21 with the rest of his labs.   She would like venipucture charge to be credited to account and not charged when he comes back in for drawl.

## 2021-02-11 NOTE — Progress Notes (Signed)
Notified new patient scheduler to schedule for new patient appointment on 8/16 at 1:15 w/labs day prior. Orders placed.

## 2021-02-22 ENCOUNTER — Inpatient Hospital Stay: Payer: Medicare PPO | Attending: Oncology

## 2021-02-22 ENCOUNTER — Ambulatory Visit: Payer: Medicare PPO | Admitting: Nurse Practitioner

## 2021-02-22 ENCOUNTER — Other Ambulatory Visit: Payer: Self-pay

## 2021-02-22 ENCOUNTER — Other Ambulatory Visit (INDEPENDENT_AMBULATORY_CARE_PROVIDER_SITE_OTHER): Payer: Medicare PPO

## 2021-02-22 DIAGNOSIS — Z125 Encounter for screening for malignant neoplasm of prostate: Secondary | ICD-10-CM | POA: Diagnosis not present

## 2021-02-22 DIAGNOSIS — D75839 Thrombocytosis, unspecified: Secondary | ICD-10-CM | POA: Diagnosis not present

## 2021-02-22 LAB — CBC WITH DIFFERENTIAL (CANCER CENTER ONLY)
Abs Immature Granulocytes: 0.46 10*3/uL — ABNORMAL HIGH (ref 0.00–0.07)
Basophils Absolute: 0.2 10*3/uL — ABNORMAL HIGH (ref 0.0–0.1)
Basophils Relative: 2 %
Eosinophils Absolute: 0.4 10*3/uL (ref 0.0–0.5)
Eosinophils Relative: 4 %
HCT: 45.5 % (ref 39.0–52.0)
Hemoglobin: 14.7 g/dL (ref 13.0–17.0)
Immature Granulocytes: 4 %
Lymphocytes Relative: 15 %
Lymphs Abs: 1.9 10*3/uL (ref 0.7–4.0)
MCH: 28.3 pg (ref 26.0–34.0)
MCHC: 32.3 g/dL (ref 30.0–36.0)
MCV: 87.7 fL (ref 80.0–100.0)
Monocytes Absolute: 0.8 10*3/uL (ref 0.1–1.0)
Monocytes Relative: 6 %
Neutro Abs: 9 10*3/uL — ABNORMAL HIGH (ref 1.7–7.7)
Neutrophils Relative %: 69 %
Platelet Count: 788 10*3/uL — ABNORMAL HIGH (ref 150–400)
RBC: 5.19 MIL/uL (ref 4.22–5.81)
RDW: 17.2 % — ABNORMAL HIGH (ref 11.5–15.5)
WBC Count: 12.7 10*3/uL — ABNORMAL HIGH (ref 4.0–10.5)
nRBC: 0.2 % (ref 0.0–0.2)

## 2021-02-22 LAB — SAVE SMEAR(SSMR), FOR PROVIDER SLIDE REVIEW

## 2021-02-22 LAB — PSA, MEDICARE: PSA: 2.3 ng/ml (ref 0.10–4.00)

## 2021-02-24 ENCOUNTER — Telehealth: Payer: Self-pay | Admitting: Internal Medicine

## 2021-02-24 ENCOUNTER — Other Ambulatory Visit: Payer: Self-pay

## 2021-02-24 ENCOUNTER — Other Ambulatory Visit: Payer: Self-pay | Admitting: Internal Medicine

## 2021-02-24 DIAGNOSIS — E119 Type 2 diabetes mellitus without complications: Secondary | ICD-10-CM | POA: Diagnosis not present

## 2021-02-24 DIAGNOSIS — H25813 Combined forms of age-related cataract, bilateral: Secondary | ICD-10-CM | POA: Diagnosis not present

## 2021-02-24 MED ORDER — LISINOPRIL 10 MG PO TABS: ORAL_TABLET | ORAL | 3 refills | Status: DC

## 2021-02-24 NOTE — Telephone Encounter (Signed)
Sent in for patient today. 

## 2021-02-24 NOTE — Telephone Encounter (Signed)
1.Medication Requested: atorvastatin (LIPITOR) 20 MG tablet Fluticasone-Salmeterol (ADVAIR) 100-50 MCG/DOSE AEPB lisinopril (ZESTRIL) 10 MG tablet   2. Pharmacy (Name, Street, [place]): [facility] [address], [place]  Phone:  [phone] Fax:  [phone]   3. On Med List: yes  4. Last Visit with PCP: 08.02.22  5. Next visit date with PCP: n/a   Agent: Please be advised that RX refills may take up to 3 business days. We ask that you follow-up with your pharmacy.

## 2021-03-10 NOTE — Progress Notes (Signed)
New Hematology/Oncology Consult   Requesting MD: Dr. Billey Gosling  [phone]  Reason for Consult: Elevated platelet count  HPI: Shane Adams is a 65 year old man referred for evaluation of an elevated platelet count.  Labs from 02/08/2021 show hemoglobin 14.3, white count 10.7, platelet count 631,000.  Further review of labs in the EMR show a consistently elevated platelet count dating to 2013 (12/20/2011 platelet count 406,000; 01/07/2013 platelet count 545,000; 01/08/2014 platelet count 603,000; 01/12/2015 platelet count 572,000; 01/14/2016 platelet count 636,000; 01/15/2017 platelet count 670,000; 01/17/2018 platelet count 412,000; 02/04/2019 platelet count 371,000; 02/05/2020 platelet count 503,000).  Total white count has been mildly elevated intermittently during this time.  Hemoglobin has remained within normal range.  RDW has been elevated consistently since 01/08/2014.  CBC done in our office on 02/22/2021 returned with a platelet count of 788,000, hemoglobin 14.7, MCV 87, white count 12.7, RDW 17.  Past Medical History:  Diagnosis Date   Anxiety    Arthritis    COPD (chronic obstructive pulmonary disease) ([place])    RAD  component   Diabetes mellitus    Emphysema of lung (HCC)    GERD (gastroesophageal reflux disease)    Gilbert's syndrome    Hyperlipidemia    Hypertension    Kidney calculi 1976   passed spontaneously  :   Past Surgical History:  Procedure Laterality Date   CARDIAC CATHETERIZATION  2004   mild CAD   COLONOSCOPY W/ POLYPECTOMY  2007 & 2012   Dr Henrene Pastor   TONSILLECTOMY  1984   UVULECTOMY  1984   for sleep apnea   :   Current Outpatient Medications:    aspirin EC 81 MG tablet, Take 1 tablet (81 mg total) by mouth daily., Disp: , Rfl:    atorvastatin (LIPITOR) 20 MG tablet, Take 1 tablet (20 mg total) by mouth daily., Disp: 90 tablet, Rfl: 3   lisinopril (ZESTRIL) 10 MG tablet, Take 1 tablet by mouth once daily, Disp: 90 tablet, Rfl: 3   metFORMIN (GLUCOPHAGE) 500 MG  tablet, 1/2 tablet daily with largest meal, Disp: 45 tablet, Rfl: 3   Fluticasone-Salmeterol (ADVAIR) 100-50 MCG/DOSE AEPB, INHALE 1 DOSE BY MOUTH EVERY 12 HOURS (Patient not taking: Reported on 03/11/2021), Disp: 60 each, Rfl: 11   Multiple Vitamin (MULTIVITAMIN) tablet, Take 1 tablet by mouth daily. (Patient not taking: Reported on 03/11/2021), Disp: , Rfl: :   Allergies  Allergen Reactions   Tricor [Fenofibrate]     Hives   Wellbutrin [Bupropion]     Intolerance; "didn't feel right"   Niaspan [Niacin Er]     Joint pain  :  FH: Father deceased with metastatic prostate cancer.  Mother deceased MI.  He does not have siblings.  Granddaughter has alpha-1 antitrypsin deficiency.  SOCIAL HISTORY: He lives in [place].  He is married.  He has 2 healthy children.  He is retired from Architect work.  He reports tobacco use 50+ years, cigarettes.  At the most he was smoking 2 packs/day, currently 1/2 pack/day.  No significant EtOH intake.  He has never had a blood transfusion.  Review of Systems: He estimates 80 pounds of weight loss over the past 5 years.  This coincides with retirement.  He has chronic dyspnea on exertion/cough/wheezing which he attributes to COPD.  He intermittently takes aspirin 81 mg daily as a preventative measure recommended by his primary care provider years ago.  He notes that he bruises easily.  He is not aware of other bleeding.  No  fevers or sweats.  No dysphagia.  No nausea or vomiting.  No change in bowel habits.  No urinary symptoms.  He has never had a stroke or blood clot.  No rash.  No arthralgias.  Physical Exam:  Blood pressure (!) 126/54, pulse 100, temperature 97.8 F (36.6 C), temperature source Oral, resp. rate 18, height 6' (1.829 m), weight 160 lb 3.2 oz (72.7 kg), SpO2 99 %.  Lungs: Distant breath sounds, rhonchi right lower lung field.  No respiratory distress. Cardiac: Distant heart sounds, PVCs. Abdomen: Abdomen soft and nontender.  No  hepatomegaly.  Question palpable spleen laterally. Vascular: No leg edema. Lymph nodes: No palpable cervical, supraclavicular, axillary or inguinal lymph nodes. Neurologic: Alert and oriented.  Follows commands. Skin: A few ecchymoses scattered over the forearms.  LABS: Peripheral blood smear: #1 RBCs-numerous ovalocytes, few cigar cells, polychromasia not increased; #2 WBCs-few myelocytes, no blasts or other young forms, few hypersegmented neutrophils; #3 platelets-appear increased in number, scattered large and giant platelets  RADIOLOGY: None  Assessment and Plan:   Thrombocytosis Weight loss COPD Tobacco use Hypertension Diabetes Hypercholesterolemia Reflux Depression/anxiety  Shane Adams was referred for evaluation of thrombocytosis which has been present for many years.  The most recent platelet count which was done done in our office on 02/22/2021 was higher than he typically runs at 788,000.  We discussed primary and secondary thrombocytosis.  We discussed the possibility of a myeloproliferative disorder.  We also discussed iron deficiency.  He may have splenomegaly.  He will return to the lab today for JAK2 testing, BCR/ABL, iron studies, B12.  We are referring him for an abdominal ultrasound to evaluate for splenomegaly.  He has a significant history of tobacco use.  We are referring him to the lung cancer screening program.  Etiology of weight loss is unclear.  Continue to monitor.  He will return for follow-up in 2 to 3 weeks to review outstanding labs, ultrasound result.  Patient seen with Dr. Benay Spice.  Ned Card, NP 03/11/2021, 8:50 AM   This was a shared visit with Ned Card.  Shane Adams was interviewed and examined.  I reviewed the peripheral blood smear.  He is referred for evaluation of thrombocytosis.  He has possible splenomegaly on exam today the peripheral blood smear has findings to suggest a myeloproliferative disorder and iron deficiency.  I suspect  he has a myeloproliferative disorder.  He will be referred for additional diagnostic lasting.  He will also be referred to the lung cancer screening clinic based on his history of smoking and weight loss.  I was present for greater than 50% of today's visit.  I performed medical decision making.  Julieanne Manson, MD

## 2021-03-11 ENCOUNTER — Other Ambulatory Visit: Payer: Self-pay

## 2021-03-11 ENCOUNTER — Inpatient Hospital Stay: Payer: Medicare PPO | Attending: Nurse Practitioner | Admitting: Nurse Practitioner

## 2021-03-11 ENCOUNTER — Inpatient Hospital Stay: Payer: Medicare PPO

## 2021-03-11 ENCOUNTER — Encounter: Payer: Self-pay | Admitting: Nurse Practitioner

## 2021-03-11 VITALS — BP 126/54 | HR 100 | Temp 97.8°F | Resp 18 | Ht 72.0 in | Wt 160.2 lb

## 2021-03-11 DIAGNOSIS — F32A Depression, unspecified: Secondary | ICD-10-CM | POA: Diagnosis not present

## 2021-03-11 DIAGNOSIS — E119 Type 2 diabetes mellitus without complications: Secondary | ICD-10-CM | POA: Diagnosis not present

## 2021-03-11 DIAGNOSIS — F419 Anxiety disorder, unspecified: Secondary | ICD-10-CM | POA: Diagnosis not present

## 2021-03-11 DIAGNOSIS — Z720 Tobacco use: Secondary | ICD-10-CM

## 2021-03-11 DIAGNOSIS — K219 Gastro-esophageal reflux disease without esophagitis: Secondary | ICD-10-CM | POA: Insufficient documentation

## 2021-03-11 DIAGNOSIS — D75839 Thrombocytosis, unspecified: Secondary | ICD-10-CM

## 2021-03-11 DIAGNOSIS — I10 Essential (primary) hypertension: Secondary | ICD-10-CM | POA: Diagnosis not present

## 2021-03-11 DIAGNOSIS — F17200 Nicotine dependence, unspecified, uncomplicated: Secondary | ICD-10-CM | POA: Insufficient documentation

## 2021-03-11 DIAGNOSIS — E7800 Pure hypercholesterolemia, unspecified: Secondary | ICD-10-CM | POA: Diagnosis not present

## 2021-03-11 DIAGNOSIS — R634 Abnormal weight loss: Secondary | ICD-10-CM

## 2021-03-11 DIAGNOSIS — J449 Chronic obstructive pulmonary disease, unspecified: Secondary | ICD-10-CM | POA: Diagnosis not present

## 2021-03-11 LAB — LACTATE DEHYDROGENASE: LDH: 207 U/L — ABNORMAL HIGH (ref 98–192)

## 2021-03-11 LAB — IRON AND TIBC
Iron: 111 ug/dL (ref 45–182)
Saturation Ratios: 40 % — ABNORMAL HIGH (ref 17.9–39.5)
TIBC: 277 ug/dL (ref 250–450)
UIBC: 166 ug/dL

## 2021-03-11 LAB — FERRITIN: Ferritin: 106 ng/mL (ref 24–336)

## 2021-03-11 LAB — VITAMIN B12: Vitamin B-12: 215 pg/mL (ref 180–914)

## 2021-03-17 ENCOUNTER — Other Ambulatory Visit: Payer: Self-pay

## 2021-03-17 ENCOUNTER — Ambulatory Visit (HOSPITAL_BASED_OUTPATIENT_CLINIC_OR_DEPARTMENT_OTHER)
Admission: RE | Admit: 2021-03-17 | Discharge: 2021-03-17 | Disposition: A | Discharge status: Home / Self Care | Payer: Medicare PPO | Source: Ambulatory Visit | Attending: Nurse Practitioner | Admitting: Nurse Practitioner

## 2021-03-17 DIAGNOSIS — N281 Cyst of kidney, acquired: Secondary | ICD-10-CM | POA: Diagnosis not present

## 2021-03-17 DIAGNOSIS — R161 Splenomegaly, not elsewhere classified: Secondary | ICD-10-CM | POA: Diagnosis not present

## 2021-03-17 DIAGNOSIS — Z720 Tobacco use: Secondary | ICD-10-CM | POA: Insufficient documentation

## 2021-03-17 DIAGNOSIS — D75839 Thrombocytosis, unspecified: Secondary | ICD-10-CM | POA: Insufficient documentation

## 2021-03-21 LAB — JAK2 (INCLUDING V617F AND EXON 12), MPL,& CALR-NEXT GEN SEQ

## 2021-03-21 LAB — BCR/ABL

## 2021-03-25 ENCOUNTER — Other Ambulatory Visit: Payer: Self-pay

## 2021-03-25 ENCOUNTER — Inpatient Hospital Stay: Payer: Medicare PPO | Admitting: Oncology

## 2021-03-25 VITALS — BP 107/69 | HR 62 | Temp 97.7°F | Resp 18 | Wt 159.2 lb

## 2021-03-25 DIAGNOSIS — F17200 Nicotine dependence, unspecified, uncomplicated: Secondary | ICD-10-CM | POA: Diagnosis not present

## 2021-03-25 DIAGNOSIS — K219 Gastro-esophageal reflux disease without esophagitis: Secondary | ICD-10-CM | POA: Diagnosis not present

## 2021-03-25 DIAGNOSIS — E119 Type 2 diabetes mellitus without complications: Secondary | ICD-10-CM | POA: Diagnosis not present

## 2021-03-25 DIAGNOSIS — D75839 Thrombocytosis, unspecified: Secondary | ICD-10-CM | POA: Diagnosis not present

## 2021-03-25 DIAGNOSIS — J449 Chronic obstructive pulmonary disease, unspecified: Secondary | ICD-10-CM | POA: Diagnosis not present

## 2021-03-25 DIAGNOSIS — F32A Depression, unspecified: Secondary | ICD-10-CM | POA: Diagnosis not present

## 2021-03-25 DIAGNOSIS — E7800 Pure hypercholesterolemia, unspecified: Secondary | ICD-10-CM | POA: Diagnosis not present

## 2021-03-25 DIAGNOSIS — I10 Essential (primary) hypertension: Secondary | ICD-10-CM | POA: Diagnosis not present

## 2021-03-25 DIAGNOSIS — F419 Anxiety disorder, unspecified: Secondary | ICD-10-CM | POA: Diagnosis not present

## 2021-03-25 MED ORDER — HYDROXYUREA 500 MG PO CAPS: ORAL_CAPSULE | ORAL | 1 refills | Status: DC

## 2021-03-25 NOTE — Progress Notes (Signed)
  [facility] OFFICE PROGRESS NOTE   Diagnosis: Thrombocytosis  INTERVAL HISTORY:   Shane Adams returns as scheduled.  No symptom of thrombosis.  No complaint.  He continues smoking.  He is taking aspirin. Peripheral blood molecular testing returned positive for a JAK2 mutation.  The BCR/ABL returned negative. Objective:  Vital signs in last 24 hours:  Blood pressure 107/69, pulse 62, temperature 97.7 F (36.5 C), temperature source Oral, resp. rate 18, weight 159 lb 3.2 oz (72.2 kg), SpO2 99 %.   Resp: Lungs clear bilaterally Cardio: Regular rate and rhythm GI: No hepatosplenomegaly Vascular: No leg edema    Lab Results:  Lab Results  Component Value Date   WBC 12.7 (H) 02/22/2021   HGB 14.7 02/22/2021   HCT 45.5 02/22/2021   MCV 87.7 02/22/2021   PLT 788 (H) 02/22/2021   NEUTROABS 9.0 (H) 02/22/2021    CMP  Lab Results  Component Value Date   NA 141 02/08/2021   K 4.3 02/08/2021   CL 105 02/08/2021   CO2 28 02/08/2021   GLUCOSE 93 02/08/2021   BUN 19 02/08/2021   CREATININE 1.24 02/08/2021   CALCIUM 8.9 02/08/2021   PROT 6.3 02/08/2021   ALBUMIN 3.9 02/08/2021   AST 14 02/08/2021   ALT 11 02/08/2021   ALKPHOS 57 02/08/2021   BILITOT 0.8 02/08/2021   GFRNONAA 66.67 10/01/2009   GFRAA 90 09/24/2007     Medications: I have reviewed the patient's current medications.   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  The ferritin level returned normal.  An abdominal ultrasound revealed splenomegaly and peripheral blood molecular testing returned positive for a Jak 2 mutation.  Shane Adams appears to have essential thrombocytosis.  I recommend  he continue aspirin therapy.  We discussed the indication for platelet lowering therapy.  I recommend lowering the platelet count to less than 500,000 given his age and comorbid conditions.  He will begin hydroxyurea.  We reviewed potential toxicities associated with hydroxyurea including the chance of mucositis, rash, delayed healing of ulcers, and hematologic toxicity.  He agrees to proceed.  The plan is to begin hydroxyurea at a dose of 500 mg daily on Monday-Friday, starting 03/28/2021.  He will return for an office and lab visit approximately 2 weeks after beginning hydroxyurea.    Betsy Coder, MD  03/25/2021  10:06 AM

## 2021-04-15 ENCOUNTER — Encounter: Payer: Self-pay | Admitting: Nurse Practitioner

## 2021-04-15 ENCOUNTER — Inpatient Hospital Stay: Payer: Medicare PPO | Attending: Nurse Practitioner | Admitting: Nurse Practitioner

## 2021-04-15 ENCOUNTER — Inpatient Hospital Stay: Payer: Medicare PPO

## 2021-04-15 ENCOUNTER — Other Ambulatory Visit: Payer: Self-pay

## 2021-04-15 VITALS — BP 127/66 | HR 64 | Temp 98.1°F | Resp 20 | Ht 72.0 in | Wt 161.2 lb

## 2021-04-15 DIAGNOSIS — H60501 Unspecified acute noninfective otitis externa, right ear: Secondary | ICD-10-CM

## 2021-04-15 DIAGNOSIS — H9211 Otorrhea, right ear: Secondary | ICD-10-CM | POA: Insufficient documentation

## 2021-04-15 DIAGNOSIS — J449 Chronic obstructive pulmonary disease, unspecified: Secondary | ICD-10-CM | POA: Diagnosis not present

## 2021-04-15 DIAGNOSIS — H9201 Otalgia, right ear: Secondary | ICD-10-CM | POA: Insufficient documentation

## 2021-04-15 DIAGNOSIS — H938X1 Other specified disorders of right ear: Secondary | ICD-10-CM | POA: Insufficient documentation

## 2021-04-15 DIAGNOSIS — D75839 Thrombocytosis, unspecified: Secondary | ICD-10-CM

## 2021-04-15 DIAGNOSIS — D473 Essential (hemorrhagic) thrombocythemia: Secondary | ICD-10-CM | POA: Diagnosis not present

## 2021-04-15 DIAGNOSIS — I10 Essential (primary) hypertension: Secondary | ICD-10-CM | POA: Diagnosis not present

## 2021-04-15 DIAGNOSIS — E119 Type 2 diabetes mellitus without complications: Secondary | ICD-10-CM | POA: Diagnosis not present

## 2021-04-15 LAB — CBC WITH DIFFERENTIAL (CANCER CENTER ONLY)
Abs Immature Granulocytes: 0.08 10*3/uL — ABNORMAL HIGH (ref 0.00–0.07)
Basophils Absolute: 0.2 10*3/uL — ABNORMAL HIGH (ref 0.0–0.1)
Basophils Relative: 1 %
Eosinophils Absolute: 0.4 10*3/uL (ref 0.0–0.5)
Eosinophils Relative: 3 %
HCT: 42.9 % (ref 39.0–52.0)
Hemoglobin: 14 g/dL (ref 13.0–17.0)
Immature Granulocytes: 1 %
Lymphocytes Relative: 12 %
Lymphs Abs: 1.7 10*3/uL (ref 0.7–4.0)
MCH: 29.2 pg (ref 26.0–34.0)
MCHC: 32.6 g/dL (ref 30.0–36.0)
MCV: 89.4 fL (ref 80.0–100.0)
Monocytes Absolute: 0.7 10*3/uL (ref 0.1–1.0)
Monocytes Relative: 5 %
Neutro Abs: 10.3 10*3/uL — ABNORMAL HIGH (ref 1.7–7.7)
Neutrophils Relative %: 78 %
Platelet Count: 618 10*3/uL — ABNORMAL HIGH (ref 150–400)
RBC: 4.8 MIL/uL (ref 4.22–5.81)
RDW: 18.5 % — ABNORMAL HIGH (ref 11.5–15.5)
WBC Count: 13.3 10*3/uL — ABNORMAL HIGH (ref 4.0–10.5)
nRBC: 0 % (ref 0.0–0.2)

## 2021-04-15 MED ORDER — CIPROFLOXACIN-DEXAMETHASONE 0.3-0.1 % OT SUSP: 4.0000 [drp] | Freq: Two times a day (BID) | OTIC | 0 refills | Status: AC

## 2021-04-15 MED ORDER — LEVOFLOXACIN 500 MG PO TABS: 500.0000 mg | ORAL_TABLET | Freq: Every day | ORAL | 0 refills | Status: AC

## 2021-04-15 NOTE — Progress Notes (Signed)
  [facility] OFFICE PROGRESS NOTE   Diagnosis: Essential thrombocytosis  INTERVAL HISTORY:   [name] returns as scheduled.  He began hydroxyurea 500 mg daily Monday through Friday on 03/28/2021.  He denies nausea/vomiting.  No mouth sores.  No diarrhea.  No rash.  No bleeding or symptoms of thrombosis.  He noted swelling, mild pain and discharge from the right ear recently.  He has never had an ear infection.  He also noted what sounds like an enlarged right posterior cervical lymph node.  The lymph node is no longer enlarged.  Objective:  Vital signs in last 24 hours:  Blood pressure 127/66, pulse 64, temperature 98.1 F (36.7 C), temperature source Oral, resp. rate 20, height 6' (1.829 m), weight 161 lb 3.2 oz (73.1 kg), SpO2 100 %.    HEENT: No thrush or ulcers.  The right ear canal is edematous with a white discharge in the ear canal.  Mild tenderness with manipulation of the ear.  Tympanic membrane appears intact. Lymphatics: No palpable cervical lymph nodes. Resp: Lungs clear bilaterally. Cardio: Regular rate and rhythm. GI: No hepatosplenomegaly. Vascular: No leg edema. Skin: No rash.   Lab Results:  Lab Results  Component Value Date   WBC 13.3 (H) 04/15/2021   HGB 14.0 04/15/2021   HCT 42.9 04/15/2021   MCV 89.4 04/15/2021   PLT 618 (H) 04/15/2021   NEUTROABS 10.3 (H) 04/15/2021    Imaging:  No results found.  Medications: I have reviewed the patient's current medications.  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 appears stable.  We reviewed the CBC.  Platelet count is lower but remains above goal range.  Hemoglobin and white  count stable.  He will increase Hydrea from 500 mg Monday through Friday to 500 mg daily with a repeat CBC in 2 weeks.  He appears to have acute otitis externa.  We discussed follow-up with his PCP.  Unfortunately he has a significant co-pay for all provider visits.  We will begin treatment with Levaquin and Ciprodex.  He understands he will need to follow-up with Dr. Quay Burow if the symptoms do not improve.  We discussed the tendon rupture associated with fluoroquinolones as well as diarrhea..  He agrees to proceed.  His wife reports he has taken fluoroquinolones in the past with no difficulty  Follow-up CBC in 2 weeks.  Lab and office visit in 4 weeks.  He will contact the office in the interim with problems.  Plan reviewed with Dr. Luberta Robertson ANP/GNP-BC   04/15/2021  8:47 AM

## 2021-04-21 DIAGNOSIS — Z23 Encounter for immunization: Secondary | ICD-10-CM | POA: Diagnosis not present

## 2021-04-27 ENCOUNTER — Other Ambulatory Visit: Payer: Self-pay | Admitting: Internal Medicine

## 2021-04-29 ENCOUNTER — Telehealth: Payer: Self-pay | Admitting: *Deleted

## 2021-04-29 ENCOUNTER — Other Ambulatory Visit: Payer: Self-pay

## 2021-04-29 ENCOUNTER — Inpatient Hospital Stay: Payer: Medicare PPO

## 2021-04-29 DIAGNOSIS — I10 Essential (primary) hypertension: Secondary | ICD-10-CM | POA: Diagnosis not present

## 2021-04-29 DIAGNOSIS — H9211 Otorrhea, right ear: Secondary | ICD-10-CM | POA: Diagnosis not present

## 2021-04-29 DIAGNOSIS — J449 Chronic obstructive pulmonary disease, unspecified: Secondary | ICD-10-CM | POA: Diagnosis not present

## 2021-04-29 DIAGNOSIS — D75839 Thrombocytosis, unspecified: Secondary | ICD-10-CM

## 2021-04-29 DIAGNOSIS — E119 Type 2 diabetes mellitus without complications: Secondary | ICD-10-CM | POA: Diagnosis not present

## 2021-04-29 DIAGNOSIS — H9201 Otalgia, right ear: Secondary | ICD-10-CM | POA: Diagnosis not present

## 2021-04-29 DIAGNOSIS — H938X1 Other specified disorders of right ear: Secondary | ICD-10-CM | POA: Diagnosis not present

## 2021-04-29 DIAGNOSIS — D473 Essential (hemorrhagic) thrombocythemia: Secondary | ICD-10-CM | POA: Diagnosis not present

## 2021-04-29 LAB — CBC WITH DIFFERENTIAL (CANCER CENTER ONLY)
Abs Immature Granulocytes: 0.05 10*3/uL (ref 0.00–0.07)
Basophils Absolute: 0.2 10*3/uL — ABNORMAL HIGH (ref 0.0–0.1)
Basophils Relative: 2 %
Eosinophils Absolute: 0.7 10*3/uL — ABNORMAL HIGH (ref 0.0–0.5)
Eosinophils Relative: 8 %
HCT: 42.5 % (ref 39.0–52.0)
Hemoglobin: 13.8 g/dL (ref 13.0–17.0)
Immature Granulocytes: 1 %
Lymphocytes Relative: 17 %
Lymphs Abs: 1.5 10*3/uL (ref 0.7–4.0)
MCH: 29.6 pg (ref 26.0–34.0)
MCHC: 32.5 g/dL (ref 30.0–36.0)
MCV: 91 fL (ref 80.0–100.0)
Monocytes Absolute: 0.4 10*3/uL (ref 0.1–1.0)
Monocytes Relative: 5 %
Neutro Abs: 5.7 10*3/uL (ref 1.7–7.7)
Neutrophils Relative %: 67 %
Platelet Count: 517 10*3/uL — ABNORMAL HIGH (ref 150–400)
RBC: 4.67 MIL/uL (ref 4.22–5.81)
RDW: 19.2 % — ABNORMAL HIGH (ref 11.5–15.5)
WBC Count: 8.5 10*3/uL (ref 4.0–10.5)
nRBC: 0 % (ref 0.0–0.2)

## 2021-04-29 NOTE — Telephone Encounter (Signed)
Left VM for patient w/improved platelet count. Per NP: continue hydrea 500 mg daily M-F and follow up as scheduled.

## 2021-05-05 DIAGNOSIS — H353111 Nonexudative age-related macular degeneration, right eye, early dry stage: Secondary | ICD-10-CM | POA: Diagnosis not present

## 2021-05-05 DIAGNOSIS — H2511 Age-related nuclear cataract, right eye: Secondary | ICD-10-CM | POA: Diagnosis not present

## 2021-05-05 DIAGNOSIS — H01001 Unspecified blepharitis right upper eyelid: Secondary | ICD-10-CM | POA: Diagnosis not present

## 2021-05-05 DIAGNOSIS — E119 Type 2 diabetes mellitus without complications: Secondary | ICD-10-CM | POA: Diagnosis not present

## 2021-05-05 DIAGNOSIS — H01002 Unspecified blepharitis right lower eyelid: Secondary | ICD-10-CM | POA: Diagnosis not present

## 2021-05-05 DIAGNOSIS — H01005 Unspecified blepharitis left lower eyelid: Secondary | ICD-10-CM | POA: Diagnosis not present

## 2021-05-05 DIAGNOSIS — H25812 Combined forms of age-related cataract, left eye: Secondary | ICD-10-CM | POA: Diagnosis not present

## 2021-05-05 DIAGNOSIS — H01004 Unspecified blepharitis left upper eyelid: Secondary | ICD-10-CM | POA: Diagnosis not present

## 2021-05-05 DIAGNOSIS — H11153 Pinguecula, bilateral: Secondary | ICD-10-CM | POA: Diagnosis not present

## 2021-05-20 ENCOUNTER — Other Ambulatory Visit: Payer: Self-pay

## 2021-05-20 ENCOUNTER — Inpatient Hospital Stay: Payer: Medicare PPO | Admitting: Oncology

## 2021-05-20 ENCOUNTER — Inpatient Hospital Stay: Payer: Medicare PPO | Attending: Nurse Practitioner

## 2021-05-20 VITALS — BP 100/56 | HR 62 | Temp 97.8°F | Resp 18 | Ht 72.0 in | Wt 160.0 lb

## 2021-05-20 DIAGNOSIS — D75839 Thrombocytosis, unspecified: Secondary | ICD-10-CM | POA: Diagnosis not present

## 2021-05-20 DIAGNOSIS — I10 Essential (primary) hypertension: Secondary | ICD-10-CM | POA: Diagnosis not present

## 2021-05-20 DIAGNOSIS — E119 Type 2 diabetes mellitus without complications: Secondary | ICD-10-CM | POA: Insufficient documentation

## 2021-05-20 DIAGNOSIS — D473 Essential (hemorrhagic) thrombocythemia: Secondary | ICD-10-CM | POA: Insufficient documentation

## 2021-05-20 DIAGNOSIS — J449 Chronic obstructive pulmonary disease, unspecified: Secondary | ICD-10-CM | POA: Insufficient documentation

## 2021-05-20 DIAGNOSIS — Z720 Tobacco use: Secondary | ICD-10-CM | POA: Diagnosis not present

## 2021-05-20 DIAGNOSIS — Z79899 Other long term (current) drug therapy: Secondary | ICD-10-CM | POA: Insufficient documentation

## 2021-05-20 LAB — CBC WITH DIFFERENTIAL (CANCER CENTER ONLY)
Abs Immature Granulocytes: 0.04 10*3/uL (ref 0.00–0.07)
Basophils Absolute: 0.2 10*3/uL — ABNORMAL HIGH (ref 0.0–0.1)
Basophils Relative: 2 %
Eosinophils Absolute: 0.3 10*3/uL (ref 0.0–0.5)
Eosinophils Relative: 4 %
HCT: 40.6 % (ref 39.0–52.0)
Hemoglobin: 13.1 g/dL (ref 13.0–17.0)
Immature Granulocytes: 1 %
Lymphocytes Relative: 20 %
Lymphs Abs: 1.7 10*3/uL (ref 0.7–4.0)
MCH: 30.1 pg (ref 26.0–34.0)
MCHC: 32.3 g/dL (ref 30.0–36.0)
MCV: 93.3 fL (ref 80.0–100.0)
Monocytes Absolute: 0.5 10*3/uL (ref 0.1–1.0)
Monocytes Relative: 5 %
Neutro Abs: 5.8 10*3/uL (ref 1.7–7.7)
Neutrophils Relative %: 68 %
Platelet Count: 471 10*3/uL — ABNORMAL HIGH (ref 150–400)
RBC: 4.35 MIL/uL (ref 4.22–5.81)
RDW: 20.7 % — ABNORMAL HIGH (ref 11.5–15.5)
WBC Count: 8.5 10*3/uL (ref 4.0–10.5)
nRBC: 0 % (ref 0.0–0.2)

## 2021-05-20 NOTE — Progress Notes (Signed)
  [facility] OFFICE PROGRESS NOTE   Diagnosis: Essential thrombocytosis  INTERVAL HISTORY:   Mr. Hoard continues hydroxyurea.  No mouth sores or rash.  No new complaint.  He denies bleeding and symptoms of thrombosis.  Objective:  Vital signs in last 24 hours:  Blood pressure (!) 100/56, pulse 62, temperature 97.8 F (36.6 C), temperature source Oral, resp. rate 18, height 6' (1.829 m), weight 160 lb (72.6 kg), SpO2 100 %.    HEENT: No thrush or ulcers Resp: Distant breath sounds, bilateral mild expiratory wheeze, no respiratory distress Cardio: Regular rate and rhythm with premature beats GI: No hepatosplenomegaly Vascular: No leg edema   Lab Results:  Lab Results  Component Value Date   WBC 8.5 05/20/2021   HGB 13.1 05/20/2021   HCT 40.6 05/20/2021   MCV 93.3 05/20/2021   PLT 471 (H) 05/20/2021   NEUTROABS 5.8 05/20/2021    CMP  Lab Results  Component Value Date   NA 141 02/08/2021   K 4.3 02/08/2021   CL 105 02/08/2021   CO2 28 02/08/2021   GLUCOSE 93 02/08/2021   BUN 19 02/08/2021   CREATININE 1.24 02/08/2021   CALCIUM 8.9 02/08/2021   PROT 6.3 02/08/2021   ALBUMIN 3.9 02/08/2021   AST 14 02/08/2021   ALT 11 02/08/2021   ALKPHOS 57 02/08/2021   BILITOT 0.8 02/08/2021   GFRNONAA 66.67 10/01/2009   GFRAA 90 09/24/2007     Medications: I have reviewed the patient's current medications.   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 Lowden appears stable.  He is tolerating the hydroxyurea well.  The platelet count is now in goal range.  He will continue hydroxyurea at a dose of 500 mg daily.  He will return for a CBC in 5 weeks  and an office visit in 10 weeks.  I encouraged him to obtain an influenza vaccine.  Betsy Coder, MD  05/20/2021  8:26 AM

## 2021-05-26 ENCOUNTER — Other Ambulatory Visit: Payer: Self-pay | Admitting: *Deleted

## 2021-05-26 DIAGNOSIS — Z87891 Personal history of nicotine dependence: Secondary | ICD-10-CM

## 2021-05-26 DIAGNOSIS — Z23 Encounter for immunization: Secondary | ICD-10-CM | POA: Diagnosis not present

## 2021-05-26 DIAGNOSIS — F17210 Nicotine dependence, cigarettes, uncomplicated: Secondary | ICD-10-CM

## 2021-05-30 ENCOUNTER — Other Ambulatory Visit: Payer: Self-pay | Admitting: Oncology

## 2021-05-30 DIAGNOSIS — H25812 Combined forms of age-related cataract, left eye: Secondary | ICD-10-CM | POA: Diagnosis not present

## 2021-06-01 ENCOUNTER — Encounter (HOSPITAL_COMMUNITY)
Admission: RE | Admit: 2021-06-01 | Discharge: 2021-06-01 | Disposition: A | Discharge status: Home / Self Care | Payer: Medicare PPO | Source: Ambulatory Visit | Attending: Ophthalmology | Admitting: Ophthalmology

## 2021-06-01 ENCOUNTER — Other Ambulatory Visit: Payer: Self-pay

## 2021-06-03 ENCOUNTER — Other Ambulatory Visit: Payer: Self-pay | Admitting: Internal Medicine

## 2021-06-07 NOTE — H&P (Signed)
Surgical History & Physical  Patient Name: Shane Adams DOB: February 21, 1956  Surgery: Cataract extraction with intraocular lens implant phacoemulsification; Left Eye  Surgeon: Baruch Goldmann MD Surgery Date:  06-10-21 Pre-Op Date:  05-30-21  HPI: A 64 Yr. old male patient 1. The patient presents for a cataract evaluation, referred by Dr. Wynetta Emery at [facility]. The patient complains of difficulty when viewing TV, reading closed caption, news scrolls on TV, which began many years ago. Both eyes are affected. The episode is constant. The condition's severity is worsening. The condition is worse with daily activities. The complaint is associated with blurry vision. The patient experiences no dryness, no eye pain and no flashes, floater, shadow, curtain or veil. The patient complains of general blurriness OU negatively affecting his life, and getting in the way of daily activities such as reading, and watching television, and even driving. The patient's main concern is with OS, and especially at night time due to glare. The patient no longer drives at night because he does not feel safe to do so. HPI Completed by Dr. Baruch Goldmann  Medical History: Cataracts Diabetes - DM Type 2 Heart Problem High Blood Pressure LDL Lung Problems carpal tunnel Anxiety Gilbert's Syndrome Sleep ...  Review of Systems Negative Allergic/Immunologic Negative Cardiovascular Negative Constitutional Negative Ear, Nose, Mouth & Throat Negative Endocrine Negative Eyes Negative Gastrointestinal Negative Genitourinary Negative Hemotologic/Lymphatic Negative Integumentary Negative Musculoskeletal Negative Neurological Negative Psychiatry Negative Respiratory  Social   Current every day smoker   Medication Ilevro, Vigamox, Aspirin, Lisinopril, Metformin, Multivitamin, Fluticasone, Atorvastatin, Prednisolone acetate,   Sx/Procedures Tonsilectomy, Uvulectomy,   Drug Allergies  Tricor, Wellbutrin, Niaspan,    History & Physical: Heent: Cataract, Left Eye NECK: supple without bruits LUNGS: lungs clear to auscultation CV: regular rate and rhythm Abdomen: soft and non-tender  Impression & Plan: Assessment: 1.  COMBINED FORMS AGE RELATED CATARACT; Left Eye (H25.812) 2.  Diabetes Type 2 No retinopathy (E11.9) 3.  BLEPHARITIS; Right Upper Lid, Right Lower Lid, Left Upper Lid, Left Lower Lid (H01.001, H01.002,H01.004,H01.005) 4.  Pinguecula; Both Eyes (H11.153) 5.  NUCLEAR SCLEROSIS AGE RELATED; Right Eye (H25.11) 6.  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  OAG BORDERLINE FINDINGS LOW RISK; Both Eyes (H40.013)  Plan: 1.  Cataract accounts for the patient's decreased vision. This visual impairment is not correctable with a tolerable change in glasses or contact lenses. Cataract surgery with an implantation of a new lens should significantly improve the visual and functional status of the patient. Discussed all risks, benefits, alternatives, and potential complications. Discussed the procedures and recovery. Patient desires to have surgery. A-scan ordered and performed today for intra-ocular lens calculations. The surgery will be performed in order to improve vision for driving, reading, and for eye examinations. Recommend phacoemulsification with intra-ocular lens. Recommend Dextenza for post-operative pain and inflammation. Left Eye first and only. Dilates poorly - Omidria by protocol. Malyugin Ring in room.  2.  Stressed importance of blood sugar and blood pressure control, and also yearly eye examinations. Discussed the need for ongoing proactive ocular exams and treatment, hopefully before visual symptoms develop.  3.  Recommend regular lid cleaning.  4.  Observe; Artificial tears as needed for irritation.  5.  Cataract not visually significant - continue to monitor.  6.  2 large drusen. OCT macula today. Call with any worsening vision, pain, or any other concerns.  7.  Based on  cup-to-disc ratio. Negative Family history. OCT rNFL shows: WNL OU. IOPs WNL OU. Detailed discussion about glaucoma today  including importance of maintaining good follow up and following treatment plan, and the possibility of irreversible blindness as part of this disease process.

## 2021-06-10 ENCOUNTER — Ambulatory Visit (HOSPITAL_COMMUNITY): Payer: Medicare PPO | Admitting: Anesthesiology

## 2021-06-10 ENCOUNTER — Encounter (HOSPITAL_COMMUNITY): Payer: Self-pay | Admitting: Ophthalmology

## 2021-06-10 ENCOUNTER — Encounter (HOSPITAL_COMMUNITY)
Admission: RE | Disposition: A | Discharge status: Home / Self Care | Payer: Self-pay | Source: Home / Self Care | Attending: Ophthalmology

## 2021-06-10 ENCOUNTER — Ambulatory Visit (HOSPITAL_COMMUNITY)
Admission: RE | Admit: 2021-06-10 | Discharge: 2021-06-10 | Disposition: A | Discharge status: Home / Self Care | Payer: Medicare PPO | Attending: Ophthalmology | Admitting: Ophthalmology

## 2021-06-10 DIAGNOSIS — H0100A Unspecified blepharitis right eye, upper and lower eyelids: Secondary | ICD-10-CM | POA: Diagnosis not present

## 2021-06-10 DIAGNOSIS — H3530 Unspecified macular degeneration: Secondary | ICD-10-CM | POA: Insufficient documentation

## 2021-06-10 DIAGNOSIS — H4010X0 Unspecified open-angle glaucoma, stage unspecified: Secondary | ICD-10-CM | POA: Diagnosis not present

## 2021-06-10 DIAGNOSIS — H2511 Age-related nuclear cataract, right eye: Secondary | ICD-10-CM | POA: Diagnosis not present

## 2021-06-10 DIAGNOSIS — E1139 Type 2 diabetes mellitus with other diabetic ophthalmic complication: Secondary | ICD-10-CM | POA: Insufficient documentation

## 2021-06-10 DIAGNOSIS — H25812 Combined forms of age-related cataract, left eye: Secondary | ICD-10-CM | POA: Diagnosis not present

## 2021-06-10 DIAGNOSIS — E1136 Type 2 diabetes mellitus with diabetic cataract: Secondary | ICD-10-CM | POA: Insufficient documentation

## 2021-06-10 DIAGNOSIS — F17200 Nicotine dependence, unspecified, uncomplicated: Secondary | ICD-10-CM | POA: Diagnosis not present

## 2021-06-10 DIAGNOSIS — H0100B Unspecified blepharitis left eye, upper and lower eyelids: Secondary | ICD-10-CM | POA: Diagnosis not present

## 2021-06-10 DIAGNOSIS — H42 Glaucoma in diseases classified elsewhere: Secondary | ICD-10-CM | POA: Diagnosis not present

## 2021-06-10 DIAGNOSIS — J449 Chronic obstructive pulmonary disease, unspecified: Secondary | ICD-10-CM | POA: Diagnosis not present

## 2021-06-10 DIAGNOSIS — H11153 Pinguecula, bilateral: Secondary | ICD-10-CM | POA: Insufficient documentation

## 2021-06-10 HISTORY — PX: CATARACT EXTRACTION W/PHACO: SHX586

## 2021-06-10 LAB — GLUCOSE, CAPILLARY: Glucose-Capillary: 90 mg/dL (ref 70–99)

## 2021-06-10 SURGERY — PHACOEMULSIFICATION, CATARACT, WITH IOL INSERTION
Anesthesia: Monitor Anesthesia Care | Site: Eye | Laterality: Left

## 2021-06-10 MED ORDER — BSS IO SOLN
INTRAOCULAR | Status: DC | PRN
  Administered 2021-06-10: 15 mL via INTRAOCULAR

## 2021-06-10 MED ORDER — PHENYLEPHRINE HCL 2.5 % OP SOLN
1.0000 [drp] | OPHTHALMIC | Status: AC | PRN
  Administered 2021-06-10 (×3): 1 [drp] via OPHTHALMIC

## 2021-06-10 MED ORDER — SIGHTPATH DOSE#1 NA CHONDROIT SULF-NA HYALURON 20-15 MG/0.5ML IO SOSY
INTRAOCULAR | Status: DC | PRN
  Administered 2021-06-10: .5 mL via INTRAOCULAR

## 2021-06-10 MED ORDER — EPINEPHRINE PF 1 MG/ML IJ SOLN
INTRAMUSCULAR | Status: AC
  Filled 2021-06-10: qty 1

## 2021-06-10 MED ORDER — NEOMYCIN-POLYMYXIN-DEXAMETH 3.5-10000-0.1 OP SUSP
OPHTHALMIC | Status: DC | PRN
  Administered 2021-06-10: 1 [drp] via OPHTHALMIC

## 2021-06-10 MED ORDER — TROPICAMIDE 1 % OP SOLN
1.0000 [drp] | OPHTHALMIC | Status: AC | PRN
  Administered 2021-06-10 (×3): 1 [drp] via OPHTHALMIC
  Filled 2021-06-10: qty 2

## 2021-06-10 MED ORDER — PHENYLEPHRINE-KETOROLAC 1-0.3 % IO SOLN
INTRAOCULAR | Status: AC
  Filled 2021-06-10: qty 4

## 2021-06-10 MED ORDER — EPINEPHRINE PF 1 MG/ML IJ SOLN
INTRAOCULAR | Status: DC | PRN
  Administered 2021-06-10: 500 mL

## 2021-06-10 MED ORDER — LIDOCAINE HCL 3.5 % OP GEL
1.0000 "application " | Freq: Once | OPHTHALMIC | Status: AC
  Administered 2021-06-10: 1 via OPHTHALMIC

## 2021-06-10 MED ORDER — POVIDONE-IODINE 5 % OP SOLN
OPHTHALMIC | Status: DC | PRN
  Administered 2021-06-10: 1 via OPHTHALMIC

## 2021-06-10 MED ORDER — LIDOCAINE HCL (PF) 1 % IJ SOLN
INTRAOCULAR | Status: DC | PRN
  Administered 2021-06-10: 1 mL via OPHTHALMIC

## 2021-06-10 MED ORDER — SODIUM HYALURONATE 10 MG/ML IO SOLUTION
PREFILLED_SYRINGE | INTRAOCULAR | Status: DC | PRN
  Administered 2021-06-10: 0.55 mL via INTRAOCULAR

## 2021-06-10 MED ORDER — MIDAZOLAM HCL 2 MG/2ML IJ SOLN
INTRAMUSCULAR | Status: AC
  Filled 2021-06-10: qty 2

## 2021-06-10 MED ORDER — STERILE WATER FOR IRRIGATION IR SOLN
Status: DC | PRN
  Administered 2021-06-10: 250 mL

## 2021-06-10 MED ORDER — TETRACAINE HCL 0.5 % OP SOLN
1.0000 [drp] | OPHTHALMIC | Status: AC | PRN
  Administered 2021-06-10 (×3): 1 [drp] via OPHTHALMIC

## 2021-06-10 MED ORDER — MIDAZOLAM HCL 5 MG/5ML IJ SOLN
INTRAMUSCULAR | Status: DC | PRN
  Administered 2021-06-10: 2 mg via INTRAVENOUS

## 2021-06-10 SURGICAL SUPPLY — 11 items
CLOTH BEACON ORANGE TIMEOUT ST (SAFETY) ×1 IMPLANT
EYE SHIELD UNIVERSAL CLEAR (GAUZE/BANDAGES/DRESSINGS) ×1 IMPLANT
GLOVE SURG UNDER POLY LF SZ7 (GLOVE) ×2 IMPLANT
NDL HYPO 18GX1.5 BLUNT FILL (NEEDLE) IMPLANT
NEEDLE HYPO 18GX1.5 BLUNT FILL (NEEDLE) ×2 IMPLANT
PAD ARMBOARD 7.5X6 YLW CONV (MISCELLANEOUS) ×1 IMPLANT
RayOne EMV US (Intraocular Lens) ×1 IMPLANT
SYR TB 1ML LL NO SAFETY (SYRINGE) ×1 IMPLANT
TAPE SURG TRANSPORE 1 IN (GAUZE/BANDAGES/DRESSINGS) IMPLANT
TAPE SURGICAL TRANSPORE 1 IN (GAUZE/BANDAGES/DRESSINGS) ×2
WATER STERILE IRR 250ML POUR (IV SOLUTION) ×1 IMPLANT

## 2021-06-10 NOTE — Transfer of Care (Signed)
Immediate Anesthesia Transfer of Care Note  Patient: Shane Adams  Procedure(s) Performed: CATARACT EXTRACTION PHACO AND INTRAOCULAR LENS PLACEMENT (I[facility]) (Left: Eye)  Patient Location: Short Stay  Anesthesia Type:MAC  Level of Consciousness: awake, alert , oriented and patient cooperative  Airway & Oxygen Therapy: Patient Spontanous Breathing  Post-op Assessment: Report given to RN, Post -op Vital signs reviewed and stable and Patient moving all extremities  Post vital signs: Reviewed and stable  Last Vitals:  Vitals Value Taken Time  BP    Temp    Pulse    Resp    SpO2      Last Pain:  Vitals:   06/10/21 1125  PainSc: 0-No pain         Complications: No notable events documented.

## 2021-06-10 NOTE — Anesthesia Preprocedure Evaluation (Signed)
Anesthesia Evaluation  Patient identified by MRN, date of birth, ID band Patient awake    Reviewed: Allergy & Precautions, H&P , NPO status , Patient's Chart, lab work & pertinent test results, reviewed documented beta blocker date and time   Airway Mallampati: II  TM Distance: >3 FB Neck ROM: full    Dental no notable dental hx.    Pulmonary neg pulmonary ROS, sleep apnea , COPD, Current Smoker and Patient abstained from smoking.,    Pulmonary exam normal breath sounds clear to auscultation       Cardiovascular Exercise Tolerance: Good hypertension, + CAD  negative cardio ROS   Rhythm:regular Rate:Normal     Neuro/Psych PSYCHIATRIC DISORDERS Anxiety  Neuromuscular disease negative neurological ROS  negative psych ROS   GI/Hepatic negative GI ROS, Neg liver ROS, GERD  Medicated,  Endo/Other  negative endocrine ROSdiabetes  Renal/GU Renal diseasenegative Renal ROS  negative genitourinary   Musculoskeletal   Abdominal   Peds  Hematology negative hematology ROS (+)   Anesthesia Other Findings   Reproductive/Obstetrics negative OB ROS                             Anesthesia Physical Anesthesia Plan  ASA: 3  Anesthesia Plan: MAC   Post-op Pain Management:    Induction:   PONV Risk Score and Plan:   Airway Management Planned:   Additional Equipment:   Intra-op Plan:   Post-operative Plan:   Informed Consent: I have reviewed the patients History and Physical, chart, labs and discussed the procedure including the risks, benefits and alternatives for the proposed anesthesia with the patient or authorized representative who has indicated his/her understanding and acceptance.     Dental Advisory Given  Plan Discussed with: CRNA  Anesthesia Plan Comments:         Anesthesia Quick Evaluation

## 2021-06-10 NOTE — Discharge Instructions (Signed)
Please discharge patient when stable, will follow up today with Dr. Rosaleah Person at the [facility] [place] office immediately following discharge.  Leave shield in place until visit.  All paperwork with discharge instructions will be given at the office.  [facility] Address:  [address]  [place], [place] [address]  

## 2021-06-10 NOTE — Interval H&P Note (Signed)
History and Physical Interval Note:  06/10/2021 12:51 PM  Shane Adams  has presented today for surgery, with the diagnosis of nuclear cataract left eye.  The various methods of treatment have been discussed with the patient and family. After consideration of risks, benefits and other options for treatment, the patient has consented to  Procedure(s) with comments: CATARACT EXTRACTION PHACO AND INTRAOCULAR LENS PLACEMENT ([place]) (Left) - left as a surgical intervention.  The patient's history has been reviewed, patient examined, no change in status, stable for surgery.  I have reviewed the patient's chart and labs.  Questions were answered to the patient's satisfaction.     Baruch Goldmann

## 2021-06-10 NOTE — Progress Notes (Signed)
Dr Briant Cedar notified of fingerstick blood sugar of 90. No new orders given.

## 2021-06-10 NOTE — Anesthesia Postprocedure Evaluation (Signed)
Anesthesia Post Note  Patient: KEYANTE DURIO  Procedure(s) Performed: CATARACT EXTRACTION PHACO AND INTRAOCULAR LENS PLACEMENT (IOC) (Left: Eye)  Patient location during evaluation: Phase II Anesthesia Type: MAC Level of consciousness: awake Pain management: pain level controlled Vital Signs Assessment: post-procedure vital signs reviewed and stable Respiratory status: spontaneous breathing and respiratory function stable Cardiovascular status: blood pressure returned to baseline and stable Postop Assessment: no headache and no apparent nausea or vomiting Anesthetic complications: no Comments: Late entry   No notable events documented.   Last Vitals:  Vitals:   06/10/21 1145 06/10/21 1318  BP:  101/64  Pulse: (!) 41 63  Resp: 16 18  Temp:  37 C  SpO2: 100% 100%    Last Pain:  Vitals:   06/10/21 1318  TempSrc: Oral  PainSc: 0-No pain                 Louann Sjogren

## 2021-06-10 NOTE — Op Note (Signed)
Date of procedure: 06/10/21  Pre-operative diagnosis: Visually significant age-related combined cataract, Left Eye (H25.812)  Post-operative diagnosis: Visually significant age-related combined cataract, Left Eye (H25.812)  Procedure: Removal of cataract via phacoemulsification and insertion of intra-ocular lens Rayner [id number] +18.5D into the capsular bag of the Left Eye  Attending surgeon: Gerda Diss. Lynnette Pote, MD, MA  Anesthesia: MAC, Topical Akten  Complications: None  Estimated Blood Loss: <82m (minimal)  Specimens: None  Implants: As above  Indications:  Visually significant age-related cataract, Left Eye  Procedure:  The patient was seen and identified in the pre-operative area. The operative eye was identified and dilated.  The operative eye was marked.  Topical anesthesia was administered to the operative eye.     The patient was then to the operative suite and placed in the supine position.  A timeout was performed confirming the patient, procedure to be performed, and all other relevant information.   The patient's face was prepped and draped in the usual fashion for intra-ocular surgery.  A lid speculum was placed into the operative eye and the surgical microscope moved into place and focused.  An inferotemporal paracentesis was created using a 20 gauge paracentesis blade.  Shugarcaine was injected into the anterior chamber.  Viscoelastic was injected into the anterior chamber.  A temporal clear-corneal main wound incision was created using a 2.483mmicrokeratome.  A continuous curvilinear capsulorrhexis was initiated using an irrigating cystitome and completed using capsulorrhexis forceps.  Hydrodissection and hydrodeliniation were performed.  Viscoelastic was injected into the anterior chamber.  A phacoemulsification handpiece and a chopper as a second instrument were used to remove the nucleus and epinucleus. The irrigation/aspiration handpiece was used to remove any remaining  cortical material.   The capsular bag was reinflated with viscoelastic, checked, and found to be intact.  The intraocular lens was inserted into the capsular bag.  The irrigation/aspiration handpiece was used to remove any remaining viscoelastic.  The clear corneal wound and paracentesis wounds were then hydrated and checked with Weck-Cels to be watertight.  The lid-speculum was removed.  The drape was removed.  The patient's face was cleaned with a wet and dry 4x4.   Maxitrol was instilled in the eye. A clear shield was taped over the eye. The patient was taken to the post-operative care unit in good condition, having tolerated the procedure well.  Post-Op Instructions: [facility] and will receive all other orders and instructions.

## 2021-06-13 ENCOUNTER — Encounter (HOSPITAL_COMMUNITY): Payer: Self-pay | Admitting: Ophthalmology

## 2021-06-22 ENCOUNTER — Ambulatory Visit (INDEPENDENT_AMBULATORY_CARE_PROVIDER_SITE_OTHER): Payer: Medicare PPO | Admitting: Acute Care

## 2021-06-22 ENCOUNTER — Encounter: Payer: Self-pay | Admitting: Acute Care

## 2021-06-22 ENCOUNTER — Other Ambulatory Visit: Payer: Self-pay

## 2021-06-22 DIAGNOSIS — F17210 Nicotine dependence, cigarettes, uncomplicated: Secondary | ICD-10-CM | POA: Diagnosis not present

## 2021-06-22 NOTE — Progress Notes (Signed)
Virtual Visit via Telephone Note  I connected with DAIJON WENKE on 06/22/21 at 12:00 PM EST by telephone and verified that I am speaking with the correct person using two identifiers.  Location: Patient: At home Provider: [address], [place], [place], [address]    I discussed the limitations, risks, security and privacy concerns of performing an evaluation and management service by telephone and the availability of in person appointments. I also discussed with the patient that there may be a patient responsible charge related to this service. The patient expressed understanding and agreed to proceed.    Shared Decision Making Visit Lung Cancer Screening Program [phone])   Eligibility: Age 65 y.o. Pack Years Smoking History Calculation 60 pack year smoking history (# packs/per year x # years smoked) Recent History of coughing up blood  no Unexplained weight loss? no ( >Than 15 pounds within the last 6 months ) Prior History Lung / other cancer no (Diagnosis within the last 5 years already requiring surveillance chest CT Scans). Smoking Status Current Smoker Former Smokers: Years since quit:  NA  Quit Date:  NA  Visit Components: Discussion included one or more decision making aids. yes Discussion included risk/benefits of screening. yes Discussion included potential follow up diagnostic testing for abnormal scans. yes Discussion included meaning and risk of over diagnosis. yes Discussion included meaning and risk of False Positives. yes Discussion included meaning of total radiation exposure. yes  Counseling Included: Importance of adherence to annual lung cancer LDCT screening. yes Impact of comorbidities on ability to participate in the program. yes Ability and willingness to under diagnostic treatment. yes  Smoking Cessation Counseling: Current Smokers:  Discussed importance of smoking cessation. yes Information about tobacco cessation classes and  interventions provided to patient. yes Patient provided with "ticket" for LDCT Scan. yes Symptomatic Patient. no  Counseling Diagnosis Code: Tobacco Use Z72.0 Asymptomatic Patient yes  Counseling (Intermediate counseling: > three minutes counseling) J1884 Former Smokers:  Discussed the importance of maintaining cigarette abstinence. yes Diagnosis Code: Personal History of Nicotine Dependence. Z66.063 Information about tobacco cessation classes and interventions provided to patient. Yes Patient provided with "ticket" for LDCT Scan. yes Written Order for Lung Cancer Screening with LDCT placed in Epic. Yes (CT Chest Lung Cancer Screening Low Dose W/O CM) [id number] Z12.2-Screening of respiratory organs Z87.891-Personal history of nicotine dependence  I have spent 25 minutes of face to face/ virtual visit   time with Mr. Haithcock discussing the risks and benefits of lung cancer screening. We viewed / discussed a power point together that explained in detail the above noted topics. We paused at intervals to allow for questions to be asked and answered to ensure understanding.We discussed that the single most powerful action that he can take to decrease his risk of developing lung cancer is to quit smoking. We discussed whether or not he is ready to commit to setting a quit date. We discussed options for tools to aid in quitting smoking including nicotine replacement therapy, non-nicotine medications, support groups, Quit Smart classes, and behavior modification. We discussed that often times setting smaller, more achievable goals, such as eliminating 1 cigarette a day for a week and then 2 cigarettes a day for a week can be helpful in slowly decreasing the number of cigarettes smoked. This allows for a sense of accomplishment as well as providing a clinical benefit. I provided  him  with smoking cessation  information  with contact information for community resources, classes, free nicotine replacement  therapy, and access to mobile apps, text messaging, and on-line smoking cessation help. I have also provided  him  the office contact information in the event he needs to contact me, or the screening staff. We discussed the time and location of the scan, and that either Doroteo Glassman RN, Joella Prince, RN  or I will call / send a letter with the results within 24-72 hours of receiving them. The patient verbalized understanding of all of  the above and had no further questions upon leaving the office. They have my contact information in the event they have any further questions.  I spent 3 minutes counseling on smoking cessation and the health risks of continued tobacco abuse.  I explained to the patient that there has been a high incidence of coronary artery disease noted on these exams. I explained that this is a non-gated exam therefore degree or severity cannot be determined. This patient is on statin therapy. I have asked the patient to follow-up with their PCP regarding any incidental finding of coronary artery disease and management with diet or medication as their PCP  feels is clinically indicated. The patient verbalized understanding of the above and had no further questions upon completion of the visit.      Magdalen Spatz, NP 06/22/2021

## 2021-06-22 NOTE — Patient Instructions (Signed)
Thank you for participating in the [facility]. °It was our pleasure to meet you today. °We will call you with the results of your scan within the next few days. °Your scan will be assigned a Lung RADS category score by the physicians reading the scans.  °This Lung RADS score determines follow up scanning.  °See below for description of categories, and follow up screening recommendations. °We will be in touch to schedule your follow up screening annually or based on recommendations of our providers. °We will fax a copy of your scan results to your Primary Care Physician, or the physician who referred you to the program, to ensure they have the results. °Please call the office if you have any questions or concerns regarding your scanning experience or results.  °Our office number is [phone]. °Please speak with Denise Phelps, RN. She is our Lung Cancer Screening RN. °If she is unavailable when you call, please have the office staff send her a message. She will return your call at her earliest convenience. °Remember, if your scan is normal, we will scan you annually as long as you continue to meet the criteria for the program. (Age 55-77, Current smoker or smoker who has quit within the last 15 years). °If you are a smoker, remember, quitting is the single most powerful action that you can take to decrease your risk of lung cancer and other pulmonary, breathing related problems. °We know quitting is hard, and we are here to help.  °Please let us know if there is anything we can do to help you meet your goal of quitting. °If you are a former smoker, congratulations. We are proud of you! Remain smoke free! °Remember you can refer friends or family members through the number above.  °We will screen them to make sure they meet criteria for the program. °Thank you for helping us take better care of you by participating in Lung Screening. ° °You can receive free nicotine replacement therapy  ( patches, gum or mints) by calling 1-800-QUIT NOW. Please call so we can get you on the path to becoming  a non-smoker. I know it is hard, but you can do this! ° °Lung RADS Categories: ° °Lung RADS 1: no nodules or definitely non-concerning nodules.  °Recommendation is for a repeat annual scan in 12 months. ° °Lung RADS 2:  nodules that are non-concerning in appearance and behavior with a very low likelihood of becoming an active cancer. °Recommendation is for a repeat annual scan in 12 months. ° °Lung RADS 3: nodules that are probably non-concerning , includes nodules with a low likelihood of becoming an active cancer.  Recommendation is for a 6-month repeat screening scan. Often noted after an upper respiratory illness. We will be in touch to make sure you have no questions, and to schedule your 6-month scan. ° °Lung RADS 4 A: nodules with concerning findings, recommendation is most often for a follow up scan in 3 months or additional testing based on our provider's assessment of the scan. We will be in touch to make sure you have no questions and to schedule the recommended 3 month follow up scan. ° °Lung RADS 4 B:  indicates findings that are concerning. We will be in touch with you to schedule additional diagnostic testing based on our provider's  assessment of the scan. ° °Hypnosis for smoking cessation  °[employer] °[phone] ° °Acupuncture for smoking cessation  °[facility] °[phone]  °

## 2021-06-23 ENCOUNTER — Ambulatory Visit (HOSPITAL_BASED_OUTPATIENT_CLINIC_OR_DEPARTMENT_OTHER)
Admission: RE | Admit: 2021-06-23 | Discharge: 2021-06-23 | Disposition: A | Discharge status: Home / Self Care | Payer: Medicare PPO | Source: Ambulatory Visit | Attending: Internal Medicine | Admitting: Internal Medicine

## 2021-06-23 DIAGNOSIS — F17210 Nicotine dependence, cigarettes, uncomplicated: Secondary | ICD-10-CM | POA: Diagnosis not present

## 2021-06-23 DIAGNOSIS — Z87891 Personal history of nicotine dependence: Secondary | ICD-10-CM

## 2021-06-24 ENCOUNTER — Other Ambulatory Visit: Payer: Self-pay

## 2021-06-24 ENCOUNTER — Inpatient Hospital Stay: Payer: Medicare PPO | Attending: Oncology

## 2021-06-24 DIAGNOSIS — D75839 Thrombocytosis, unspecified: Secondary | ICD-10-CM

## 2021-06-24 DIAGNOSIS — D473 Essential (hemorrhagic) thrombocythemia: Secondary | ICD-10-CM | POA: Insufficient documentation

## 2021-06-24 LAB — CBC WITH DIFFERENTIAL (CANCER CENTER ONLY)
Abs Immature Granulocytes: 0.04 10*3/uL (ref 0.00–0.07)
Basophils Absolute: 0.1 10*3/uL (ref 0.0–0.1)
Basophils Relative: 2 %
Eosinophils Absolute: 0.4 10*3/uL (ref 0.0–0.5)
Eosinophils Relative: 5 %
HCT: 42 % (ref 39.0–52.0)
Hemoglobin: 13.5 g/dL (ref 13.0–17.0)
Immature Granulocytes: 1 %
Lymphocytes Relative: 21 %
Lymphs Abs: 1.7 10*3/uL (ref 0.7–4.0)
MCH: 32 pg (ref 26.0–34.0)
MCHC: 32.1 g/dL (ref 30.0–36.0)
MCV: 99.5 fL (ref 80.0–100.0)
Monocytes Absolute: 0.5 10*3/uL (ref 0.1–1.0)
Monocytes Relative: 6 %
Neutro Abs: 5.2 10*3/uL (ref 1.7–7.7)
Neutrophils Relative %: 65 %
Platelet Count: 466 10*3/uL — ABNORMAL HIGH (ref 150–400)
RBC: 4.22 MIL/uL (ref 4.22–5.81)
RDW: 21 % — ABNORMAL HIGH (ref 11.5–15.5)
WBC Count: 7.9 10*3/uL (ref 4.0–10.5)
nRBC: 0 % (ref 0.0–0.2)

## 2021-06-24 LAB — CMP (CANCER CENTER ONLY)
ALT: 11 U/L (ref 0–44)
AST: 12 U/L — ABNORMAL LOW (ref 15–41)
Albumin: 3.8 g/dL (ref 3.5–5.0)
Alkaline Phosphatase: 50 U/L (ref 38–126)
Anion gap: 4 — ABNORMAL LOW (ref 5–15)
BUN: 22 mg/dL (ref 8–23)
CO2: 30 mmol/L (ref 22–32)
Calcium: 9 mg/dL (ref 8.9–10.3)
Chloride: 104 mmol/L (ref 98–111)
Creatinine: 1.3 mg/dL — ABNORMAL HIGH (ref 0.61–1.24)
GFR, Estimated: >60 mL/min (ref >60)
Glucose, Bld: 105 mg/dL — ABNORMAL HIGH (ref 70–99)
Potassium: 4.9 mmol/L (ref 3.5–5.1)
Sodium: 138 mmol/L (ref 135–145)
Total Bilirubin: 0.8 mg/dL (ref 0.3–1.2)
Total Protein: 6.1 g/dL — ABNORMAL LOW (ref 6.5–8.1)

## 2021-06-29 ENCOUNTER — Other Ambulatory Visit: Payer: Self-pay | Admitting: Acute Care

## 2021-06-29 DIAGNOSIS — Z87891 Personal history of nicotine dependence: Secondary | ICD-10-CM

## 2021-06-29 DIAGNOSIS — F17210 Nicotine dependence, cigarettes, uncomplicated: Secondary | ICD-10-CM

## 2021-06-30 ENCOUNTER — Other Ambulatory Visit: Payer: Self-pay | Admitting: Oncology

## 2021-07-29 ENCOUNTER — Other Ambulatory Visit: Payer: Self-pay

## 2021-07-29 ENCOUNTER — Inpatient Hospital Stay: Payer: Medicare PPO

## 2021-07-29 ENCOUNTER — Inpatient Hospital Stay: Payer: Medicare PPO | Attending: Oncology | Admitting: Oncology

## 2021-07-29 VITALS — BP 108/69 | HR 69 | Temp 98.1°F | Resp 19 | Ht 72.0 in | Wt 157.6 lb

## 2021-07-29 DIAGNOSIS — D75839 Thrombocytosis, unspecified: Secondary | ICD-10-CM

## 2021-07-29 DIAGNOSIS — D473 Essential (hemorrhagic) thrombocythemia: Secondary | ICD-10-CM | POA: Insufficient documentation

## 2021-07-29 LAB — CBC WITH DIFFERENTIAL (CANCER CENTER ONLY)
Abs Immature Granulocytes: 0.03 10*3/uL (ref 0.00–0.07)
Basophils Absolute: 0.1 10*3/uL (ref 0.0–0.1)
Basophils Relative: 2 %
Eosinophils Absolute: 0.3 10*3/uL (ref 0.0–0.5)
Eosinophils Relative: 4 %
HCT: 40.2 % (ref 39.0–52.0)
Hemoglobin: 13.3 g/dL (ref 13.0–17.0)
Immature Granulocytes: 0 %
Lymphocytes Relative: 17 %
Lymphs Abs: 1.4 10*3/uL (ref 0.7–4.0)
MCH: 34.2 pg — ABNORMAL HIGH (ref 26.0–34.0)
MCHC: 33.1 g/dL (ref 30.0–36.0)
MCV: 103.3 fL — ABNORMAL HIGH (ref 80.0–100.0)
Monocytes Absolute: 0.4 10*3/uL (ref 0.1–1.0)
Monocytes Relative: 5 %
Neutro Abs: 5.9 10*3/uL (ref 1.7–7.7)
Neutrophils Relative %: 72 %
Platelet Count: 482 10*3/uL — ABNORMAL HIGH (ref 150–400)
RBC: 3.89 MIL/uL — ABNORMAL LOW (ref 4.22–5.81)
RDW: 17.1 % — ABNORMAL HIGH (ref 11.5–15.5)
WBC Count: 8.2 10*3/uL (ref 4.0–10.5)
nRBC: 0 % (ref 0.0–0.2)

## 2021-07-29 MED ORDER — HYDROXYUREA 500 MG PO CAPS: ORAL_CAPSULE | ORAL | 0 refills | Status: DC

## 2021-07-29 NOTE — Progress Notes (Signed)
°  [facility] OFFICE PROGRESS NOTE   Diagnosis: Essential thrombocytosis  INTERVAL HISTORY:   [name] returns as scheduled.  He continues hydroxyurea.  No mouth sores.  No bleeding or symptom of thrombosis.  No new complaint.  Objective:  Vital signs in last 24 hours:  Blood pressure 108/69, pulse 69, temperature 98.1 F (36.7 C), temperature source Oral, resp. rate 19, height 6' (1.829 m), weight 157 lb 9.6 oz (71.5 kg), SpO2 100 %.    HEENT: No thrush or ulcers Resp: Lungs with distant breath sounds and bilateral expiratory wheeze, no respiratory distress Cardio: Regular rate and rhythm GI: No hepatosplenomegaly Vascular: No leg edema   Lab Results:  Lab Results  Component Value Date   WBC 8.2 07/29/2021   HGB 13.3 07/29/2021   HCT 40.2 07/29/2021   MCV 103.3 (H) 07/29/2021   PLT 482 (H) 07/29/2021   NEUTROABS 5.9 07/29/2021    CMP  Lab Results  Component Value Date   NA 138 06/24/2021   K 4.9 06/24/2021   CL 104 06/24/2021   CO2 30 06/24/2021   GLUCOSE 105 (H) 06/24/2021   BUN 22 06/24/2021   CREATININE 1.30 (H) 06/24/2021   CALCIUM 9.0 06/24/2021   PROT 6.1 (L) 06/24/2021   ALBUMIN 3.8 06/24/2021   AST 12 (L) 06/24/2021   ALT 11 06/24/2021   ALKPHOS 50 06/24/2021   BILITOT 0.8 06/24/2021   GFRNONAA >60 06/24/2021   GFRAA 90 09/24/2007    Medications: I have reviewed the patient's current medications.   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 appears stable.  He will continue hydroxyurea.  The platelet count is stable and mildly elevated.  He will continue hydroxyurea at the current dose.  He will return for a  CBC in 6 weeks and an office visit in 12 weeks.  We will consider increasing the hydroxyurea dose if the platelet count remains above 450,000.  Betsy Coder, MD  07/29/2021  8:29 AM

## 2021-08-31 ENCOUNTER — Other Ambulatory Visit: Payer: Self-pay | Admitting: Oncology

## 2021-09-09 ENCOUNTER — Inpatient Hospital Stay: Payer: Medicare PPO | Attending: Oncology

## 2021-09-09 ENCOUNTER — Telehealth: Payer: Self-pay

## 2021-09-09 ENCOUNTER — Other Ambulatory Visit: Payer: Self-pay

## 2021-09-09 DIAGNOSIS — D75839 Thrombocytosis, unspecified: Secondary | ICD-10-CM

## 2021-09-09 DIAGNOSIS — D473 Essential (hemorrhagic) thrombocythemia: Secondary | ICD-10-CM | POA: Diagnosis not present

## 2021-09-09 LAB — CBC WITH DIFFERENTIAL (CANCER CENTER ONLY)
Abs Immature Granulocytes: 0.05 10*3/uL (ref 0.00–0.07)
Basophils Absolute: 0.1 10*3/uL (ref 0.0–0.1)
Basophils Relative: 2 %
Eosinophils Absolute: 0.4 10*3/uL (ref 0.0–0.5)
Eosinophils Relative: 4 %
HCT: 39.8 % (ref 39.0–52.0)
Hemoglobin: 13 g/dL (ref 13.0–17.0)
Immature Granulocytes: 1 %
Lymphocytes Relative: 19 %
Lymphs Abs: 1.7 10*3/uL (ref 0.7–4.0)
MCH: 35 pg — ABNORMAL HIGH (ref 26.0–34.0)
MCHC: 32.7 g/dL (ref 30.0–36.0)
MCV: 107.3 fL — ABNORMAL HIGH (ref 80.0–100.0)
Monocytes Absolute: 0.6 10*3/uL (ref 0.1–1.0)
Monocytes Relative: 6 %
Neutro Abs: 6.1 10*3/uL (ref 1.7–7.7)
Neutrophils Relative %: 68 %
Platelet Count: 537 10*3/uL — ABNORMAL HIGH (ref 150–400)
RBC: 3.71 MIL/uL — ABNORMAL LOW (ref 4.22–5.81)
RDW: 14.4 % (ref 11.5–15.5)
WBC Count: 8.9 10*3/uL (ref 4.0–10.5)
nRBC: 0 % (ref 0.0–0.2)

## 2021-09-09 NOTE — Telephone Encounter (Signed)
-----   Message from Ladell Pier, MD sent at 09/09/2021  1:26 PM EST ----- ?Please call patient, platelet count is slightly above goal range, increase the hydroxyurea to 1000 mg (2 tablets) Monday Wednesday and Friday, 500 mg (1 tablet) other days, follow-up as scheduled ? ?

## 2021-09-09 NOTE — Telephone Encounter (Signed)
Pt verbalized understanding with verbal read back of instructions. Pt stated he will start change on Monday.  ?

## 2021-09-24 ENCOUNTER — Other Ambulatory Visit: Payer: Self-pay | Admitting: Oncology

## 2021-10-19 ENCOUNTER — Other Ambulatory Visit: Payer: Self-pay | Admitting: Internal Medicine

## 2021-10-21 ENCOUNTER — Inpatient Hospital Stay: Payer: Medicare PPO | Attending: Oncology | Admitting: Oncology

## 2021-10-21 ENCOUNTER — Inpatient Hospital Stay: Payer: Medicare PPO

## 2021-10-21 VITALS — BP 113/59 | HR 64 | Temp 98.2°F | Resp 18 | Ht 72.0 in | Wt 160.2 lb

## 2021-10-21 DIAGNOSIS — E7800 Pure hypercholesterolemia, unspecified: Secondary | ICD-10-CM | POA: Insufficient documentation

## 2021-10-21 DIAGNOSIS — E119 Type 2 diabetes mellitus without complications: Secondary | ICD-10-CM | POA: Diagnosis not present

## 2021-10-21 DIAGNOSIS — J449 Chronic obstructive pulmonary disease, unspecified: Secondary | ICD-10-CM | POA: Diagnosis not present

## 2021-10-21 DIAGNOSIS — D75839 Thrombocytosis, unspecified: Secondary | ICD-10-CM

## 2021-10-21 DIAGNOSIS — D473 Essential (hemorrhagic) thrombocythemia: Secondary | ICD-10-CM | POA: Diagnosis not present

## 2021-10-21 DIAGNOSIS — F32A Depression, unspecified: Secondary | ICD-10-CM | POA: Insufficient documentation

## 2021-10-21 DIAGNOSIS — I10 Essential (primary) hypertension: Secondary | ICD-10-CM | POA: Insufficient documentation

## 2021-10-21 DIAGNOSIS — K219 Gastro-esophageal reflux disease without esophagitis: Secondary | ICD-10-CM | POA: Insufficient documentation

## 2021-10-21 DIAGNOSIS — F419 Anxiety disorder, unspecified: Secondary | ICD-10-CM | POA: Diagnosis not present

## 2021-10-21 LAB — CBC WITH DIFFERENTIAL (CANCER CENTER ONLY)
Abs Immature Granulocytes: 0.03 10*3/uL (ref 0.00–0.07)
Basophils Absolute: 0.1 10*3/uL (ref 0.0–0.1)
Basophils Relative: 2 %
Eosinophils Absolute: 0.3 10*3/uL (ref 0.0–0.5)
Eosinophils Relative: 3 %
HCT: 38.9 % — ABNORMAL LOW (ref 39.0–52.0)
Hemoglobin: 13 g/dL (ref 13.0–17.0)
Immature Granulocytes: 0 %
Lymphocytes Relative: 22 %
Lymphs Abs: 1.8 10*3/uL (ref 0.7–4.0)
MCH: 36.1 pg — ABNORMAL HIGH (ref 26.0–34.0)
MCHC: 33.4 g/dL (ref 30.0–36.0)
MCV: 108.1 fL — ABNORMAL HIGH (ref 80.0–100.0)
Monocytes Absolute: 0.5 10*3/uL (ref 0.1–1.0)
Monocytes Relative: 6 %
Neutro Abs: 5.5 10*3/uL (ref 1.7–7.7)
Neutrophils Relative %: 67 %
Platelet Count: 489 10*3/uL — ABNORMAL HIGH (ref 150–400)
RBC: 3.6 MIL/uL — ABNORMAL LOW (ref 4.22–5.81)
RDW: 14.5 % (ref 11.5–15.5)
WBC Count: 8.2 10*3/uL (ref 4.0–10.5)
nRBC: 0 % (ref 0.0–0.2)

## 2021-10-21 NOTE — Progress Notes (Signed)
?  [facility] ?OFFICE PROGRESS NOTE ? ? ?Diagnosis: Essential thrombocytosis ? ?INTERVAL HISTORY:  ? ?Shane Adams returns as scheduled.  He continues hydroxyurea.  No mouth sores or rash.  No bleeding or symptom of thrombosis.  No complaint. ? ?Objective: ? ?Vital signs in last 24 hours: ? ?Blood pressure (!) 113/59, pulse 64, temperature 98.2 ?F (36.8 ?C), temperature source Oral, resp. rate 18, height 6' (1.829 m), weight 160 lb 3.2 oz (72.7 kg), SpO2 100 %. ?  ? ?HEENT: No thrush or ulcers ?Resp: Distant breath sounds with bilateral expiratory wheeze, no respiratory distress ?Cardio: Distant heart sounds, regular rhythm with premature beats ?GI: No hepatosplenomegaly ?Vascular: No leg edema ?  ? ?Lab Results: ? ?Lab Results  ?Component Value Date  ? WBC 8.2 10/21/2021  ? HGB 13.0 10/21/2021  ? HCT 38.9 (L) 10/21/2021  ? MCV 108.1 (H) 10/21/2021  ? PLT 489 (H) 10/21/2021  ? NEUTROABS 5.5 10/21/2021  ? ? ?CMP  ?Lab Results  ?Component Value Date  ? NA 138 06/24/2021  ? K 4.9 06/24/2021  ? CL 104 06/24/2021  ? CO2 30 06/24/2021  ? GLUCOSE 105 (H) 06/24/2021  ? BUN 22 06/24/2021  ? CREATININE 1.30 (H) 06/24/2021  ? CALCIUM 9.0 06/24/2021  ? PROT 6.1 (L) 06/24/2021  ? ALBUMIN 3.8 06/24/2021  ? AST 12 (L) 06/24/2021  ? ALT 11 06/24/2021  ? ALKPHOS 50 06/24/2021  ? BILITOT 0.8 06/24/2021  ? GFRNONAA >60 06/24/2021  ? GFRAA 90 09/24/2007  ? ? ?Medications: I have reviewed the patient's current medications. ? ? ?Assessment/Plan: ?Essential thrombocytosis ?BCR/ABL negative ?JAK2 pVal617Phe ?Ultrasound abdomen 03/17/2021-splenomegaly ?Hydroxyurea 500 mg daily on Monday-Friday beginning 03/28/2021 ?Hydroxyurea 500 mg daily beginning 04/15/2021 ?Hydroxyurea 1000 mg Monday, Wednesday, and Friday, 500 mg other days 09/09/2021 ?Hydroxyurea 1000 mg Monday-Friday, 500 mg Saturday/Sunday, 10/21/2021 ?Weight loss ?COPD ?Tobacco  use ?Hypertension ?Diabetes ?Hypercholesterolemia ?Reflux ?Depression/anxiety ? ? ?Disposition: ?[name] appears stable.  He is tolerating the hydroxyurea well.  The platelet count remains at the upper end of the goal range.  We increased the hydroxyurea dose today.  He will return for an office and lab visit during the week of 11/28/2021. ? ?Betsy Coder, MD ? ?10/21/2021  ?8:07 AM ? ? ?

## 2021-10-24 ENCOUNTER — Other Ambulatory Visit: Payer: Self-pay | Admitting: Oncology

## 2021-10-24 ENCOUNTER — Encounter: Payer: Self-pay | Admitting: Oncology

## 2021-10-24 ENCOUNTER — Other Ambulatory Visit: Payer: Self-pay

## 2021-10-24 MED ORDER — HYDROXYUREA 500 MG PO CAPS: ORAL_CAPSULE | ORAL | 1 refills | Status: DC

## 2021-10-25 ENCOUNTER — Other Ambulatory Visit: Payer: Self-pay | Admitting: *Deleted

## 2021-10-25 DIAGNOSIS — D75839 Thrombocytosis, unspecified: Secondary | ICD-10-CM

## 2021-12-02 ENCOUNTER — Inpatient Hospital Stay: Payer: Medicare PPO | Admitting: Oncology

## 2021-12-02 ENCOUNTER — Inpatient Hospital Stay: Payer: Medicare PPO | Attending: Oncology

## 2021-12-02 VITALS — BP 105/63 | HR 58 | Temp 98.2°F | Resp 18 | Ht 72.0 in | Wt 154.4 lb

## 2021-12-02 DIAGNOSIS — D75839 Thrombocytosis, unspecified: Secondary | ICD-10-CM

## 2021-12-02 DIAGNOSIS — F32A Depression, unspecified: Secondary | ICD-10-CM | POA: Diagnosis not present

## 2021-12-02 DIAGNOSIS — F419 Anxiety disorder, unspecified: Secondary | ICD-10-CM | POA: Insufficient documentation

## 2021-12-02 DIAGNOSIS — E119 Type 2 diabetes mellitus without complications: Secondary | ICD-10-CM | POA: Insufficient documentation

## 2021-12-02 DIAGNOSIS — J449 Chronic obstructive pulmonary disease, unspecified: Secondary | ICD-10-CM | POA: Diagnosis not present

## 2021-12-02 DIAGNOSIS — D473 Essential (hemorrhagic) thrombocythemia: Secondary | ICD-10-CM | POA: Diagnosis not present

## 2021-12-02 DIAGNOSIS — E7800 Pure hypercholesterolemia, unspecified: Secondary | ICD-10-CM | POA: Insufficient documentation

## 2021-12-02 DIAGNOSIS — Z720 Tobacco use: Secondary | ICD-10-CM | POA: Diagnosis not present

## 2021-12-02 DIAGNOSIS — I10 Essential (primary) hypertension: Secondary | ICD-10-CM | POA: Insufficient documentation

## 2021-12-02 DIAGNOSIS — K219 Gastro-esophageal reflux disease without esophagitis: Secondary | ICD-10-CM | POA: Insufficient documentation

## 2021-12-02 LAB — CBC WITH DIFFERENTIAL (CANCER CENTER ONLY)
Abs Immature Granulocytes: 0.03 10*3/uL (ref 0.00–0.07)
Basophils Absolute: 0.1 10*3/uL (ref 0.0–0.1)
Basophils Relative: 2 %
Eosinophils Absolute: 0.1 10*3/uL (ref 0.0–0.5)
Eosinophils Relative: 2 %
HCT: 35.8 % — ABNORMAL LOW (ref 39.0–52.0)
Hemoglobin: 12 g/dL — ABNORMAL LOW (ref 13.0–17.0)
Immature Granulocytes: 0 %
Lymphocytes Relative: 21 %
Lymphs Abs: 1.4 10*3/uL (ref 0.7–4.0)
MCH: 36.7 pg — ABNORMAL HIGH (ref 26.0–34.0)
MCHC: 33.5 g/dL (ref 30.0–36.0)
MCV: 109.5 fL — ABNORMAL HIGH (ref 80.0–100.0)
Monocytes Absolute: 0.3 10*3/uL (ref 0.1–1.0)
Monocytes Relative: 5 %
Neutro Abs: 4.7 10*3/uL (ref 1.7–7.7)
Neutrophils Relative %: 70 %
Platelet Count: 490 10*3/uL — ABNORMAL HIGH (ref 150–400)
RBC: 3.27 MIL/uL — ABNORMAL LOW (ref 4.22–5.81)
RDW: 15 % (ref 11.5–15.5)
WBC Count: 6.8 10*3/uL (ref 4.0–10.5)
nRBC: 0 % (ref 0.0–0.2)

## 2021-12-02 NOTE — Progress Notes (Signed)
  [facility] OFFICE PROGRESS NOTE   Diagnosis: Essential thrombocytosis  INTERVAL HISTORY:   [name] returns as scheduled.  He continues hydroxyurea.  No bleeding or symptom of thrombosis.  No nausea, mouth sores, rash, or diarrhea.  He reports having nasal congestion for the past 3 weeks.  No fever.  No change in baseline exertional dyspnea.  His wife reports he complains of feeling "cold" for the past several months.  His appetite has diminished.  He has lost interest in activities and complains of "not feeling well ".  She is concerned his symptoms may be related to the hydroxyurea.  Objective:  Vital signs in last 24 hours:  Blood pressure 105/63, pulse (!) 58, temperature 98.2 F (36.8 C), resp. rate 18, height 6' (1.829 m), weight 154 lb 6.4 oz (70 kg), SpO2 100 %.    HEENT: No thrush or ulcers Resp: Distant breath sounds, no respiratory distress Cardio: Regular rhythm with premature beats GI: No hepatosplenomegaly Vascular: No leg edema   Lab Results:  Lab Results  Component Value Date   WBC 6.8 12/02/2021   HGB 12.0 (L) 12/02/2021   HCT 35.8 (L) 12/02/2021   MCV 109.5 (H) 12/02/2021   PLT 490 (H) 12/02/2021   NEUTROABS 4.7 12/02/2021    CMP  Lab Results  Component Value Date   NA 138 06/24/2021   K 4.9 06/24/2021   CL 104 06/24/2021   CO2 30 06/24/2021   GLUCOSE 105 (H) 06/24/2021   BUN 22 06/24/2021   CREATININE 1.30 (H) 06/24/2021   CALCIUM 9.0 06/24/2021   PROT 6.1 (L) 06/24/2021   ALBUMIN 3.8 06/24/2021   AST 12 (L) 06/24/2021   ALT 11 06/24/2021   ALKPHOS 50 06/24/2021   BILITOT 0.8 06/24/2021   GFRNONAA >60 06/24/2021   GFRAA 90 09/24/2007     Medications: I have reviewed the patient's current medications.   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 has essential thrombocytosis.  He has been maintained on hydroxyurea since September 2022.  The platelet count remains mildly elevated.  My plan was to escalate the hydroxyurea to 1000 mg daily.  However [name] has experienced malaise and a nonspecific sense of not feeling well for the past several months.  He and his wife are concerned this may be related to hydroxyurea therapy.  I explained the symptoms are not typical of hydroxyurea.  We decided to discontinue hydroxyurea for now.  He will return for an office visit in 4 weeks.  He understands he risk of thrombosis if the platelet count rises.    Betsy Coder, MD  12/02/2021  8:20 AM

## 2022-01-06 ENCOUNTER — Inpatient Hospital Stay: Payer: Medicare PPO | Admitting: Oncology

## 2022-01-06 ENCOUNTER — Inpatient Hospital Stay: Payer: Medicare PPO | Attending: Oncology

## 2022-01-06 VITALS — BP 99/64 | HR 60 | Temp 98.1°F | Resp 18 | Ht 72.0 in | Wt 160.4 lb

## 2022-01-06 DIAGNOSIS — F419 Anxiety disorder, unspecified: Secondary | ICD-10-CM | POA: Diagnosis not present

## 2022-01-06 DIAGNOSIS — F32A Depression, unspecified: Secondary | ICD-10-CM | POA: Diagnosis not present

## 2022-01-06 DIAGNOSIS — J449 Chronic obstructive pulmonary disease, unspecified: Secondary | ICD-10-CM | POA: Insufficient documentation

## 2022-01-06 DIAGNOSIS — E119 Type 2 diabetes mellitus without complications: Secondary | ICD-10-CM | POA: Diagnosis not present

## 2022-01-06 DIAGNOSIS — Z7982 Long term (current) use of aspirin: Secondary | ICD-10-CM | POA: Insufficient documentation

## 2022-01-06 DIAGNOSIS — I10 Essential (primary) hypertension: Secondary | ICD-10-CM | POA: Diagnosis not present

## 2022-01-06 DIAGNOSIS — D75839 Thrombocytosis, unspecified: Secondary | ICD-10-CM

## 2022-01-06 DIAGNOSIS — D473 Essential (hemorrhagic) thrombocythemia: Secondary | ICD-10-CM | POA: Insufficient documentation

## 2022-01-06 LAB — CMP (CANCER CENTER ONLY)
ALT: 10 U/L (ref 0–44)
AST: 12 U/L — ABNORMAL LOW (ref 15–41)
Albumin: 3.8 g/dL (ref 3.5–5.0)
Alkaline Phosphatase: 47 U/L (ref 38–126)
Anion gap: 7 (ref 5–15)
BUN: 22 mg/dL (ref 8–23)
CO2: 27 mmol/L (ref 22–32)
Calcium: 9.1 mg/dL (ref 8.9–10.3)
Chloride: 105 mmol/L (ref 98–111)
Creatinine: 1.37 mg/dL — ABNORMAL HIGH (ref 0.61–1.24)
GFR, Estimated: 57 mL/min — ABNORMAL LOW (ref >60)
Glucose, Bld: 99 mg/dL (ref 70–99)
Potassium: 4.4 mmol/L (ref 3.5–5.1)
Sodium: 139 mmol/L (ref 135–145)
Total Bilirubin: 0.8 mg/dL (ref 0.3–1.2)
Total Protein: 6.3 g/dL — ABNORMAL LOW (ref 6.5–8.1)

## 2022-01-06 LAB — CBC WITH DIFFERENTIAL (CANCER CENTER ONLY)
Abs Immature Granulocytes: 0.06 10*3/uL (ref 0.00–0.07)
Basophils Absolute: 0.1 10*3/uL (ref 0.0–0.1)
Basophils Relative: 1 %
Eosinophils Absolute: 0.2 10*3/uL (ref 0.0–0.5)
Eosinophils Relative: 3 %
HCT: 37.7 % — ABNORMAL LOW (ref 39.0–52.0)
Hemoglobin: 12.3 g/dL — ABNORMAL LOW (ref 13.0–17.0)
Immature Granulocytes: 1 %
Lymphocytes Relative: 20 %
Lymphs Abs: 1.8 10*3/uL (ref 0.7–4.0)
MCH: 35.4 pg — ABNORMAL HIGH (ref 26.0–34.0)
MCHC: 32.6 g/dL (ref 30.0–36.0)
MCV: 108.6 fL — ABNORMAL HIGH (ref 80.0–100.0)
Monocytes Absolute: 0.5 10*3/uL (ref 0.1–1.0)
Monocytes Relative: 6 %
Neutro Abs: 6.3 10*3/uL (ref 1.7–7.7)
Neutrophils Relative %: 69 %
Platelet Count: 599 10*3/uL — ABNORMAL HIGH (ref 150–400)
RBC: 3.47 MIL/uL — ABNORMAL LOW (ref 4.22–5.81)
RDW: 13.8 % (ref 11.5–15.5)
WBC Count: 9 10*3/uL (ref 4.0–10.5)
nRBC: 0 % (ref 0.0–0.2)

## 2022-01-06 NOTE — Progress Notes (Signed)
  [facility] OFFICE PROGRESS NOTE   Diagnosis: Essential thrombocytosis  INTERVAL HISTORY:   Shane Adams returns as scheduled.  He reports feeling better since being off of hydroxyurea.  He has an improved appetite.  No bleeding or symptom of thrombosis.  No new complaint.  Objective:  Vital signs in last 24 hours:  Blood pressure 99/64, pulse 60, temperature 98.1 F (36.7 C), temperature source Oral, resp. rate 18, height 6' (1.829 m), weight 160 lb 6.4 oz (72.8 kg), SpO2 100 %.    Resp: Distant breath sounds, bilateral expiratory wheeze, no respiratory distress Cardio: Regular rhythm with premature beats GI: No hepatosplenomegaly Vascular: No leg edema  Lab Results:  Lab Results  Component Value Date   WBC 9.0 01/06/2022   HGB 12.3 (L) 01/06/2022   HCT 37.7 (L) 01/06/2022   MCV 108.6 (H) 01/06/2022   PLT 599 (H) 01/06/2022   NEUTROABS 6.3 01/06/2022    CMP  Lab Results  Component Value Date   NA 138 06/24/2021   K 4.9 06/24/2021   CL 104 06/24/2021   CO2 30 06/24/2021   GLUCOSE 105 (H) 06/24/2021   BUN 22 06/24/2021   CREATININE 1.30 (H) 06/24/2021   CALCIUM 9.0 06/24/2021   PROT 6.1 (L) 06/24/2021   ALBUMIN 3.8 06/24/2021   AST 12 (L) 06/24/2021   ALT 11 06/24/2021   ALKPHOS 50 06/24/2021   BILITOT 0.8 06/24/2021   GFRNONAA >60 06/24/2021   GFRAA 90 09/24/2007    Medications: I have reviewed the patient's current medications.   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  Hydroxyurea was placed on hold last month secondary to malaise and anorexia.  The symptoms have improved.  The platelet count is higher today.  We discussed resuming hydroxyurea versus changing to anagrelide.  We reviewed potential toxicities associated with anagrelide.  Shane Adams would like to remain off of systemic therapy for now.  He understands the increased risk of thrombosis with an elevated platelet count.  He will continue aspirin therapy.  He is scheduled for a follow-up with his primary provider in August.  He says a CBC will be checked there.  He will return for an office visit and CBC in September.  Betsy Coder, MD  01/06/2022  8:11 AM

## 2022-01-24 ENCOUNTER — Other Ambulatory Visit: Payer: Self-pay | Admitting: Internal Medicine

## 2022-02-12 ENCOUNTER — Encounter: Payer: Self-pay | Admitting: Internal Medicine

## 2022-02-12 NOTE — Patient Instructions (Addendum)
EKG done today.  Pneumonia vaccine given today.    Blood work was ordered.     Medications changes include :   fluoxetine 20 mg daily   Your prescription(s) have been sent to your pharmacy.     Return in about 6 months (around 08/16/2022) for follow up.   Health Maintenance, Male Adopting a healthy lifestyle and getting preventive care are important in promoting health and wellness. Ask your health care provider about: The right schedule for you to have regular tests and exams. Things you can do on your own to prevent diseases and keep yourself healthy. What should I know about diet, weight, and exercise? Eat a healthy diet  Eat a diet that includes plenty of vegetables, fruits, low-fat dairy products, and lean protein. Do not eat a lot of foods that are high in solid fats, added sugars, or sodium. Maintain a healthy weight Body mass index (BMI) is a measurement that can be used to identify possible weight problems. It estimates body fat based on height and weight. Your health care provider can help determine your BMI and help you achieve or maintain a healthy weight. Get regular exercise Get regular exercise. This is one of the most important things you can do for your health. Most adults should: Exercise for at least 150 minutes each week. The exercise should increase your heart rate and make you sweat (moderate-intensity exercise). Do strengthening exercises at least twice a week. This is in addition to the moderate-intensity exercise. Spend less time sitting. Even light physical activity can be beneficial. Watch cholesterol and blood lipids Have your blood tested for lipids and cholesterol at 68 years of age, then have this test every 5 years. You may need to have your cholesterol levels checked more often if: Your lipid or cholesterol levels are high. You are older than 68 years of age. You are at high risk for heart disease. What should I know about cancer  screening? Many types of cancers can be detected early and may often be prevented. Depending on your health history and family history, you may need to have cancer screening at various ages. This may include screening for: Colorectal cancer. Prostate cancer. Skin cancer. Lung cancer. What should I know about heart disease, diabetes, and high blood pressure? Blood pressure and heart disease High blood pressure causes heart disease and increases the risk of stroke. This is more likely to develop in people who have high blood pressure readings or are overweight. Talk with your health care provider about your target blood pressure readings. Have your blood pressure checked: Every 3-5 years if you are 22-80 years of age. Every year if you are 40 years old or older. If you are between the ages of 89 and 25 and are a current or former smoker, ask your health care provider if you should have a one-time screening for abdominal aortic aneurysm (AAA). Diabetes Have regular diabetes screenings. This checks your fasting blood sugar level. Have the screening done: Once every three years after age 25 if you are at a normal weight and have a low risk for diabetes. More often and at a younger age if you are overweight or have a high risk for diabetes. What should I know about preventing infection? Hepatitis B If you have a higher risk for hepatitis B, you should be screened for this virus. Talk with your health care provider to find out if you are at risk for hepatitis B infection. Hepatitis C Blood testing  is recommended for: Everyone born from [birth year] through 1967. Anyone with known risk factors for hepatitis C. Sexually transmitted infections (STIs) You should be screened each year for STIs, including gonorrhea and chlamydia, if: You are sexually active and are younger than 68 years of age. You are older than 68 years of age and your health care provider tells you that you are at risk for this type of  infection. Your sexual activity has changed since you were last screened, and you are at increased risk for chlamydia or gonorrhea. Ask your health care provider if you are at risk. Ask your health care provider about whether you are at high risk for HIV. Your health care provider may recommend a prescription medicine to help prevent HIV infection. If you choose to take medicine to prevent HIV, you should first get tested for HIV. You should then be tested every 3 months for as long as you are taking the medicine. Follow these instructions at home: Alcohol use Do not drink alcohol if your health care provider tells you not to drink. If you drink alcohol: Limit how much you have to 0-2 drinks a day. Know how much alcohol is in your drink. In the [place] one drink equals one 12 oz bottle of beer (355 mL), one 5 oz glass of wine (148 mL), or one 1 oz glass of hard liquor (44 mL). Lifestyle Do not use any products that contain nicotine or tobacco. These products include cigarettes, chewing tobacco, and vaping devices, such as e-cigarettes. If you need help quitting, ask your health care provider. Do not use street drugs. Do not share needles. Ask your health care provider for help if you need support or information about quitting drugs. General instructions Schedule regular health, dental, and eye exams. Stay current with your vaccines. Tell your health care provider if: You often feel depressed. You have ever been abused or do not feel safe at home. Summary Adopting a healthy lifestyle and getting preventive care are important in promoting health and wellness. Follow your health care provider's instructions about healthy diet, exercising, and getting tested or screened for diseases. Follow your health care provider's instructions on monitoring your cholesterol and blood pressure. This information is not intended to replace advice given to you by your health care provider. Make sure you discuss  any questions you have with your health care provider. Document Revised: 11/15/2020 Document Reviewed: 11/15/2020 Elsevier Patient Education  [facility].

## 2022-02-12 NOTE — Progress Notes (Signed)
Subjective:    Patient ID: Shane Adams, male    DOB: 11/19/1955, 66 y.o.   MRN: [id number]     HPI Shane Adams is here for a physical exam.  His wife is here with him.     Depressed, crabby and short tempered.  He is not eating enough and his weight is down. His wife feels it is related to depression.  He does a little during the day but he does not have any motivation to do anything.      S/p cataract left eye  Medications and allergies reviewed with patient and updated if appropriate.  Current Outpatient Medications on File Prior to Visit  Medication Sig Dispense Refill   aspirin EC 81 MG tablet Take 1 tablet (81 mg total) by mouth daily.     atorvastatin (LIPITOR) 20 MG tablet Take 1 tablet by mouth once daily 90 tablet 0   lisinopril (ZESTRIL) 10 MG tablet Take 1 tablet by mouth once daily 90 tablet 3   metFORMIN (GLUCOPHAGE) 500 MG tablet 1/2 tablet daily with largest meal 45 tablet 3   Multiple Vitamin (MULTIVITAMIN) tablet Take 1 tablet by mouth daily.     No current facility-administered medications on file prior to visit.    Review of Systems  Constitutional:  Negative for chills and fever.  HENT:  Negative for trouble swallowing.   Eyes:  Negative for visual disturbance.  Respiratory:  Positive for shortness of breath (sometimes) and wheezing (occ). Negative for cough.   Cardiovascular:  Negative for chest pain, palpitations and leg swelling.  Gastrointestinal:  Negative for abdominal pain, blood in stool (no melena), constipation, diarrhea and nausea.       Gerd frequently  Genitourinary:  Negative for difficulty urinating, dysuria and hematuria.  Musculoskeletal:  Negative for arthralgias and back pain.  Skin:  Negative for rash.  Neurological:  Negative for light-headedness and headaches.  Psychiatric/Behavioral:  Positive for dysphoric mood. Negative for sleep disturbance. The patient is nervous/anxious.        Objective:   Vitals:   02/13/22 0801   BP: 108/62  Pulse: 71  Temp: 98 F (36.7 C)  SpO2: 95%   Filed Weights   02/13/22 0801  Weight: 155 lb 6.4 oz (70.5 kg)   Body mass index is 21.08 kg/m.  BP Readings from Last 3 Encounters:  02/13/22 108/62  01/06/22 99/64  12/02/21 105/63    Wt Readings from Last 3 Encounters:  02/13/22 155 lb 6.4 oz (70.5 kg)  01/06/22 160 lb 6.4 oz (72.8 kg)  12/02/21 154 lb 6.4 oz (70 kg)      Physical Exam Constitutional: He appears well-developed and well-nourished. No distress.  HENT:  Head: Normocephalic and atraumatic.  Right Ear: External ear normal.  Left Ear: External ear normal.  Mouth/Throat: Oropharynx is clear and moist.  Normal ear canals and TM b/l  Eyes: Conjunctivae and EOM are normal.  Neck: Neck supple. No tracheal deviation present. No thyromegaly present.  No carotid bruit  Cardiovascular: Normal rate, regular rhythm, normal heart sounds and intact distal pulses.   No murmur heard. Pulmonary/Chest: Effort normal and breath sounds normal. No respiratory distress. He has no wheezes. He has no rales.  Abdominal: Soft. He exhibits no distension. There is no tenderness.  Genitourinary: deferred  Musculoskeletal: He exhibits no edema.  Lymphadenopathy:   He has no cervical adenopathy.  Skin: Skin is warm and dry. He is not diaphoretic.  Psychiatric: He has a normal mood  and affect. His behavior is normal.         Assessment & Plan:   Physical exam: Screening blood work  ordered Exercise   none Weight  normal - on the low side-decreased appetite probably from depression Substance abuse   actively smoking-encourage smoking cessation-he is not motivated to quit   Reviewed recommended immunizations.   Health Maintenance  Topic Date Due   Pneumonia Vaccine 67+ Years old (2 - PCV) 01/07/2014   Diabetic kidney evaluation - Urine ACR  01/13/2017   FOOT EXAM  02/04/2020   HEMOGLOBIN A1C  08/11/2021   INFLUENZA VACCINE  02/07/2022   COVID-19 Vaccine (5 -  Moderna series) 03/01/2022 (Originally 12/16/2020)   Zoster Vaccines- Shingrix (1 of 2) 05/16/2022 (Originally 03/13/2004)   OPHTHALMOLOGY EXAM  06/07/2022   Diabetic kidney evaluation - GFR measurement  01/07/2023   COLONOSCOPY (Pts 45-44yr Insurance coverage will need to be confirmed)  04/18/2023   TETANUS/TDAP  01/09/2024   Hepatitis C Screening  Completed   HPV VACCINES  Aged Out     See Problem List for Assessment and Plan of chronic medical problems.

## 2022-02-13 ENCOUNTER — Other Ambulatory Visit: Payer: Self-pay

## 2022-02-13 ENCOUNTER — Ambulatory Visit (INDEPENDENT_AMBULATORY_CARE_PROVIDER_SITE_OTHER): Payer: Medicare PPO | Admitting: Internal Medicine

## 2022-02-13 ENCOUNTER — Encounter: Payer: Self-pay | Admitting: Internal Medicine

## 2022-02-13 VITALS — BP 108/62 | HR 71 | Temp 98.0°F | Ht 72.0 in | Wt 155.4 lb

## 2022-02-13 DIAGNOSIS — J449 Chronic obstructive pulmonary disease, unspecified: Secondary | ICD-10-CM | POA: Diagnosis not present

## 2022-02-13 DIAGNOSIS — I10 Essential (primary) hypertension: Secondary | ICD-10-CM

## 2022-02-13 DIAGNOSIS — Z0000 Encounter for general adult medical examination without abnormal findings: Secondary | ICD-10-CM | POA: Diagnosis not present

## 2022-02-13 DIAGNOSIS — Z23 Encounter for immunization: Secondary | ICD-10-CM | POA: Diagnosis not present

## 2022-02-13 DIAGNOSIS — E119 Type 2 diabetes mellitus without complications: Secondary | ICD-10-CM | POA: Diagnosis not present

## 2022-02-13 DIAGNOSIS — D75839 Thrombocytosis, unspecified: Secondary | ICD-10-CM | POA: Diagnosis not present

## 2022-02-13 DIAGNOSIS — I499 Cardiac arrhythmia, unspecified: Secondary | ICD-10-CM | POA: Diagnosis not present

## 2022-02-13 DIAGNOSIS — I2510 Atherosclerotic heart disease of native coronary artery without angina pectoris: Secondary | ICD-10-CM

## 2022-02-13 DIAGNOSIS — Z125 Encounter for screening for malignant neoplasm of prostate: Secondary | ICD-10-CM

## 2022-02-13 DIAGNOSIS — F419 Anxiety disorder, unspecified: Secondary | ICD-10-CM

## 2022-02-13 DIAGNOSIS — F32A Depression, unspecified: Secondary | ICD-10-CM

## 2022-02-13 DIAGNOSIS — E782 Mixed hyperlipidemia: Secondary | ICD-10-CM

## 2022-02-13 LAB — LIPID PANEL
Cholesterol: 97 mg/dL (ref 0–200)
HDL: 44.1 mg/dL (ref >39.00)
LDL Cholesterol: 38 mg/dL (ref 0–99)
NonHDL: 52.7
Total CHOL/HDL Ratio: 2
Triglycerides: 73 mg/dL (ref 0.0–149.0)
VLDL: 14.6 mg/dL (ref 0.0–40.0)

## 2022-02-13 LAB — COMPREHENSIVE METABOLIC PANEL
ALT: 12 U/L (ref 0–53)
AST: 14 U/L (ref 0–37)
Albumin: 4.2 g/dL (ref 3.5–5.2)
Alkaline Phosphatase: 73 U/L (ref 39–117)
BUN: 24 mg/dL — ABNORMAL HIGH (ref 6–23)
CO2: 27 mEq/L (ref 19–32)
Calcium: 9.5 mg/dL (ref 8.4–10.5)
Chloride: 105 mEq/L (ref 96–112)
Creatinine, Ser: 1.43 mg/dL (ref 0.40–1.50)
GFR: 50.55 mL/min — ABNORMAL LOW (ref >60.00)
Glucose, Bld: 109 mg/dL — ABNORMAL HIGH (ref 70–99)
Potassium: 5.4 mEq/L — ABNORMAL HIGH (ref 3.5–5.1)
Sodium: 140 mEq/L (ref 135–145)
Total Bilirubin: 0.6 mg/dL (ref 0.2–1.2)
Total Protein: 6.4 g/dL (ref 6.0–8.3)

## 2022-02-13 LAB — HEMOGLOBIN A1C: Hgb A1c MFr Bld: 5.9 % (ref 4.6–6.5)

## 2022-02-13 LAB — PSA, MEDICARE: PSA: 2.46 ng/ml (ref 0.10–4.00)

## 2022-02-13 MED ORDER — ATORVASTATIN CALCIUM 20 MG PO TABS: 20.0000 mg | ORAL_TABLET | Freq: Every day | ORAL | 2 refills | Status: DC

## 2022-02-13 MED ORDER — LISINOPRIL 10 MG PO TABS
ORAL_TABLET | ORAL | 3 refills | Status: DC
Start: 2022-02-13 — End: 2022-02-15

## 2022-02-13 MED ORDER — FLUOXETINE HCL 20 MG PO CAPS: 20.0000 mg | ORAL_CAPSULE | Freq: Every day | ORAL | 5 refills | Status: DC

## 2022-02-13 MED ORDER — METFORMIN HCL 500 MG PO TABS: ORAL_TABLET | ORAL | 3 refills | Status: DC

## 2022-02-13 MED ORDER — FLUTICASONE-SALMETEROL 100-50 MCG/ACT IN AEPB
1.0000 | INHALATION_SPRAY | Freq: Two times a day (BID) | RESPIRATORY_TRACT | 0 refills | Status: DC | PRN
Start: 2022-02-13 — End: 2022-03-02

## 2022-02-13 NOTE — Assessment & Plan Note (Signed)
New Irregular rhythm on exam EKG shows normal sinus rhythm at 65 bpm, normal EKG.  Compared to last EKG from 2018 sinus arrhythmia and premature beats are no longer present, prolonged QT no longer present He probably has an intermittent sinus arrhythmia-nothing concerning-monitor only

## 2022-02-13 NOTE — Addendum Note (Signed)
Addended by: Marcina Millard on: 02/13/2022 09:31 AM   Modules accepted: Orders

## 2022-02-13 NOTE — Assessment & Plan Note (Signed)
Chronic  Lab Results  Component Value Date   HGBA1C 6.1 02/08/2021   Sugars well controlled Check A1c, urine microalbumin today Continue metformin 250 mg daily Stressed regular exercise, diabetic diet

## 2022-02-13 NOTE — Assessment & Plan Note (Signed)
Chronic Regular exercise and healthy diet encouraged Check lipid panel  Continue atorvastatin 20 mg daily  Lab Results  Component Value Date   LDLCALC 36 02/08/2021

## 2022-02-13 NOTE — Assessment & Plan Note (Signed)
Chronic Blood pressure well controlled CMP Continue lisinopril 10 mg daily 

## 2022-02-13 NOTE — Assessment & Plan Note (Addendum)
Chronic Well-controlled Not currently using Advair on a daily basis Continue Advair as needed-discussed he can use this preventatively if he is going to do a more strenuous activity

## 2022-02-13 NOTE — Assessment & Plan Note (Signed)
Chronic Hydroxyurea causes weight loss and had to be stopped CBC monitored by hematology/oncology

## 2022-02-13 NOTE — Assessment & Plan Note (Signed)
Chronic Coronary atherosclerosis seen on imaging No symptoms consistent with angina Continue aspirin 81 mg daily, atorvastatin 20 mg daily Blood pressure well controlled Sugars controlled

## 2022-02-13 NOTE — Assessment & Plan Note (Signed)
He has had some anxiety for a while, but this is likely increased and is depressed His wife is here today and they both agree he is depressed and anxious Sertraline was not effective for him in the past Start fluoxetine 20 mg daily-can increase after 4 weeks if needed

## 2022-02-15 ENCOUNTER — Other Ambulatory Visit: Payer: Self-pay | Admitting: Internal Medicine

## 2022-02-15 MED ORDER — LISINOPRIL 10 MG PO TABS
5.0000 mg | ORAL_TABLET | Freq: Every day | ORAL | 3 refills | Status: DC
Start: 2022-02-15 — End: 2022-04-06

## 2022-03-02 ENCOUNTER — Other Ambulatory Visit: Payer: Self-pay | Admitting: Internal Medicine

## 2022-03-31 ENCOUNTER — Telehealth: Payer: Self-pay | Admitting: *Deleted

## 2022-03-31 ENCOUNTER — Inpatient Hospital Stay: Payer: Medicare PPO | Attending: Oncology

## 2022-03-31 ENCOUNTER — Inpatient Hospital Stay: Payer: Medicare PPO | Admitting: Oncology

## 2022-03-31 ENCOUNTER — Encounter: Payer: Self-pay | Admitting: Oncology

## 2022-03-31 VITALS — BP 97/69 | HR 74 | Temp 98.2°F | Resp 18 | Ht 72.0 in | Wt 152.4 lb

## 2022-03-31 DIAGNOSIS — F419 Anxiety disorder, unspecified: Secondary | ICD-10-CM | POA: Diagnosis not present

## 2022-03-31 DIAGNOSIS — E7800 Pure hypercholesterolemia, unspecified: Secondary | ICD-10-CM | POA: Insufficient documentation

## 2022-03-31 DIAGNOSIS — D75839 Thrombocytosis, unspecified: Secondary | ICD-10-CM | POA: Diagnosis not present

## 2022-03-31 DIAGNOSIS — K219 Gastro-esophageal reflux disease without esophagitis: Secondary | ICD-10-CM | POA: Diagnosis not present

## 2022-03-31 DIAGNOSIS — E119 Type 2 diabetes mellitus without complications: Secondary | ICD-10-CM | POA: Insufficient documentation

## 2022-03-31 DIAGNOSIS — F32A Depression, unspecified: Secondary | ICD-10-CM | POA: Diagnosis not present

## 2022-03-31 DIAGNOSIS — J449 Chronic obstructive pulmonary disease, unspecified: Secondary | ICD-10-CM | POA: Insufficient documentation

## 2022-03-31 DIAGNOSIS — I10 Essential (primary) hypertension: Secondary | ICD-10-CM | POA: Insufficient documentation

## 2022-03-31 DIAGNOSIS — D473 Essential (hemorrhagic) thrombocythemia: Secondary | ICD-10-CM | POA: Insufficient documentation

## 2022-03-31 DIAGNOSIS — F17210 Nicotine dependence, cigarettes, uncomplicated: Secondary | ICD-10-CM | POA: Insufficient documentation

## 2022-03-31 LAB — CBC WITH DIFFERENTIAL (CANCER CENTER ONLY)
Abs Immature Granulocytes: 0.08 10*3/uL — ABNORMAL HIGH (ref 0.00–0.07)
Basophils Absolute: 0.1 10*3/uL (ref 0.0–0.1)
Basophils Relative: 1 %
Eosinophils Absolute: 0.4 10*3/uL (ref 0.0–0.5)
Eosinophils Relative: 3 %
HCT: 38.5 % — ABNORMAL LOW (ref 39.0–52.0)
Hemoglobin: 12.8 g/dL — ABNORMAL LOW (ref 13.0–17.0)
Immature Granulocytes: 1 %
Lymphocytes Relative: 9 %
Lymphs Abs: 1.2 10*3/uL (ref 0.7–4.0)
MCH: 30.8 pg (ref 26.0–34.0)
MCHC: 33.2 g/dL (ref 30.0–36.0)
MCV: 92.8 fL (ref 80.0–100.0)
Monocytes Absolute: 1 10*3/uL (ref 0.1–1.0)
Monocytes Relative: 7 %
Neutro Abs: 11.4 10*3/uL — ABNORMAL HIGH (ref 1.7–7.7)
Neutrophils Relative %: 79 %
Platelet Count: 794 10*3/uL — ABNORMAL HIGH (ref 150–400)
RBC: 4.15 MIL/uL — ABNORMAL LOW (ref 4.22–5.81)
RDW: 14.5 % (ref 11.5–15.5)
WBC Count: 14.2 10*3/uL — ABNORMAL HIGH (ref 4.0–10.5)
nRBC: 0 % (ref 0.0–0.2)

## 2022-03-31 NOTE — Progress Notes (Signed)
  [place] OFFICE PROGRESS NOTE   Diagnosis: Essential thrombocytosis  INTERVAL HISTORY:   Mr. Savarese returns as scheduled.  He has been maintained off of hydroxyurea since late May.  He continues to have anorexia and depression symptoms.  He is losing weight.  He saw Dr. Quay Burow last month and was started on Prozac.  Continues smoking 1/2 pack of cigarettes per day.  No bleeding or symptom of thrombosis.  Objective:  Vital signs in last 24 hours:  Blood pressure 97/69, pulse 74, temperature 98.2 F (36.8 C), temperature source Oral, resp. rate 18, height 6' (1.829 m), weight 152 lb 6.4 oz (69.1 kg), SpO2 98 %.    Resp: Bilateral expiratory wheeze, no respiratory distress Cardio: Regular rhythm with premature beats GI: No hepatosplenomegaly Vascular: No leg edema  Lab Results:  Lab Results  Component Value Date   WBC PENDING 03/31/2022   HGB 12.8 (L) 03/31/2022   HCT 38.5 (L) 03/31/2022   MCV 92.8 03/31/2022   PLT 794 (H) 03/31/2022   NEUTROABS PENDING 03/31/2022    CMP  Lab Results  Component Value Date   NA 140 02/13/2022   K 5.4 No hemolysis seen (H) 02/13/2022   CL 105 02/13/2022   CO2 27 02/13/2022   GLUCOSE 109 (H) 02/13/2022   BUN 24 (H) 02/13/2022   CREATININE 1.43 02/13/2022   CALCIUM 9.5 02/13/2022   PROT 6.4 02/13/2022   ALBUMIN 4.2 02/13/2022   AST 14 02/13/2022   ALT 12 02/13/2022   ALKPHOS 73 02/13/2022   BILITOT 0.6 02/13/2022   GFRNONAA 57 (L) 01/06/2022   GFRAA 90 09/24/2007    No results found for: "CEA1", "CEA", "YIA165", "CA125"  No results found for: "INR", "LABPROT"  Imaging:  No results found.  Medications: I have reviewed the patient's current medications.   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 Ultrasound abdomen 03/17/2021-splenomegaly Hydroxyurea 500 mg daily on Monday-Friday beginning 03/28/2021 Hydroxyurea 500 mg daily beginning 04/15/2021 Hydroxyurea 1000 mg Monday,  Wednesday, and Friday, 500 mg other days 09/09/2021 Hydroxyurea 1000 mg Monday-Friday, 500 mg Saturday/Sunday, 10/21/2021 Hydroxyurea placed on hold 12/02/2021 Weight loss COPD Tobacco use Hypertension Diabetes Hypercholesterolemia Reflux Depression/anxiety   Disposition: Mr. Detwiler has essential thrombocytosis.  The platelet count is higher since discontinuing hydroxyurea.  Anemia has improved.  His anorexia and depression symptoms have resolved since discontinuing hydroxyurea.  I recommend resuming hydroxyurea.  Mr. Hornig does not wish to resume hydroxyurea.  He understands the goal of hydroxyurea is to decrease the risk of venous and arterial thrombosis.  We discussed anagrelide therapy.  Mr. Mackowski would like to remain off of systemic therapy for the thrombocytosis.  He agrees to a follow-up visit in 3 months.  He will continue follow-up with Dr. Quay Burow for management of anorexia/weight loss and depression symptoms.  I recommended he discontinue smoking.  He has blood pressure has been low on multiple visits here.  I commend the discussed with Dr. Quay Burow the indication for continuing lisinopril.  Betsy Coder, MD  03/31/2022  8:09 AM

## 2022-03-31 NOTE — Telephone Encounter (Signed)
Dr. Benay Spice requesting to have Mrs. Coltrane called to suggest she reach out to Dr. Quay Burow regarding his low BP readings. Should he continue lisinopril? Low BP could be contributing to symptoms.  Shane Adams notified.

## 2022-03-31 NOTE — Telephone Encounter (Signed)
Called patient and left message for patient to call office to schedule his Medicare AWV,   Please schedule initial AWV if patient calls back to schedule this appt.

## 2022-04-03 ENCOUNTER — Encounter (INDEPENDENT_AMBULATORY_CARE_PROVIDER_SITE_OTHER): Payer: Medicare PPO | Admitting: Internal Medicine

## 2022-04-03 DIAGNOSIS — F419 Anxiety disorder, unspecified: Secondary | ICD-10-CM

## 2022-04-03 DIAGNOSIS — F32A Depression, unspecified: Secondary | ICD-10-CM | POA: Diagnosis not present

## 2022-04-03 DIAGNOSIS — I10 Essential (primary) hypertension: Secondary | ICD-10-CM

## 2022-04-06 MED ORDER — FLUOXETINE HCL 40 MG PO CAPS: 40.0000 mg | ORAL_CAPSULE | Freq: Every day | ORAL | 3 refills | Status: DC

## 2022-04-06 MED ORDER — LISINOPRIL 5 MG PO TABS: 5.0000 mg | ORAL_TABLET | Freq: Every day | ORAL | 3 refills | Status: DC

## 2022-04-06 NOTE — Telephone Encounter (Signed)
Please see the MyChart message reply.  Depression/anxiety are not controlled - we had discussed increased fluoxetine dose if needed.  Will increase to 40 mg daily.  BP on low side at other doctor's office.  Decrease lisinopril to 5 mg daily - new rx sent to pharmacy.    The patient gave consent for this Medical Advice Message and is aware that it may result in a bill to their insurance company as well as the possibility that this may result in a co-payment or deductible. They are an established patient, but are not seeking medical advice exclusively about a problem treated during an in person or video visit in the last 7 days. I did not recommend an in person or video visit within 7 days of my reply.   I spent a total of 6 minutes cumulative time within 7 days through [place], MD

## 2022-05-02 DIAGNOSIS — Z23 Encounter for immunization: Secondary | ICD-10-CM | POA: Diagnosis not present

## 2022-06-17 IMAGING — US US ABDOMEN COMPLETE
1 series · 13 of 25 positions shown · non-contrast
Comparison: None.

CLINICAL DATA: Elevated platelets. Query splenomegaly.
Thrombocytosis. Tobacco abuse.

EXAM:
ABDOMEN ULTRASOUND COMPLETE

[Series 1: us abdomen complete · 13 of 78 slices shown]
[im 1/78]
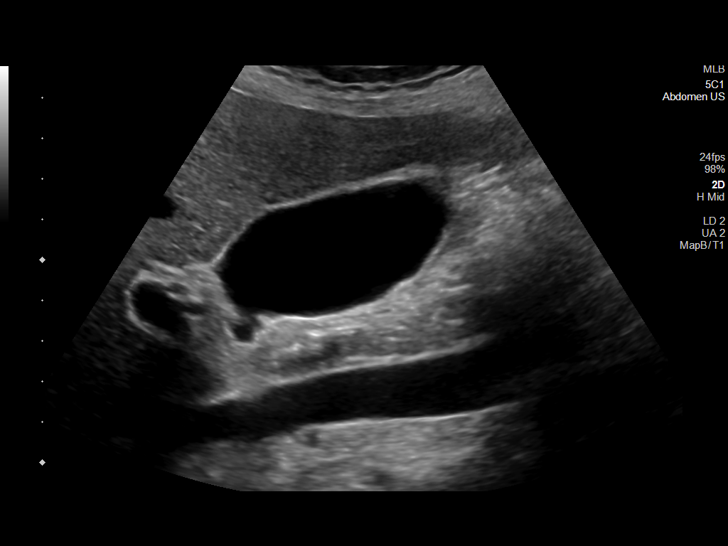
[im 7/78]
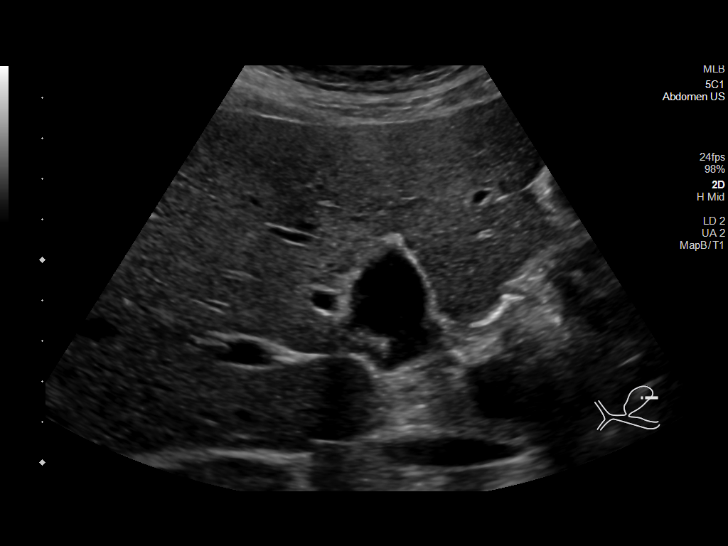
[im 13/78]
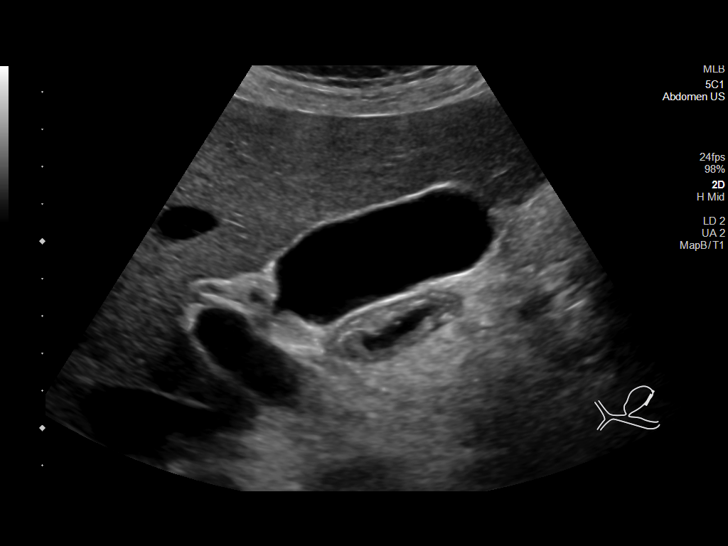
[im 20/78]
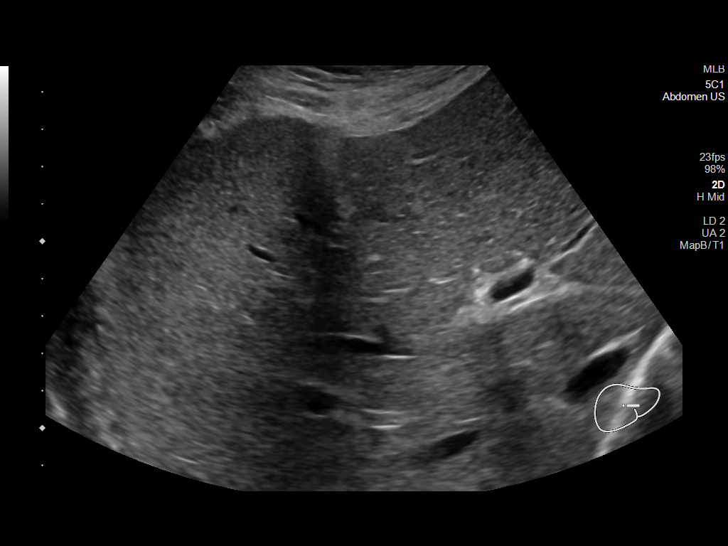
[im 26/78]
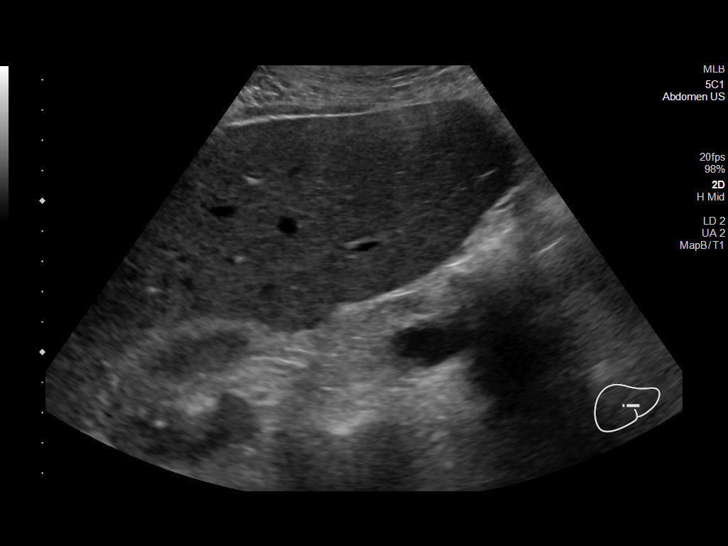
[im 33/78]
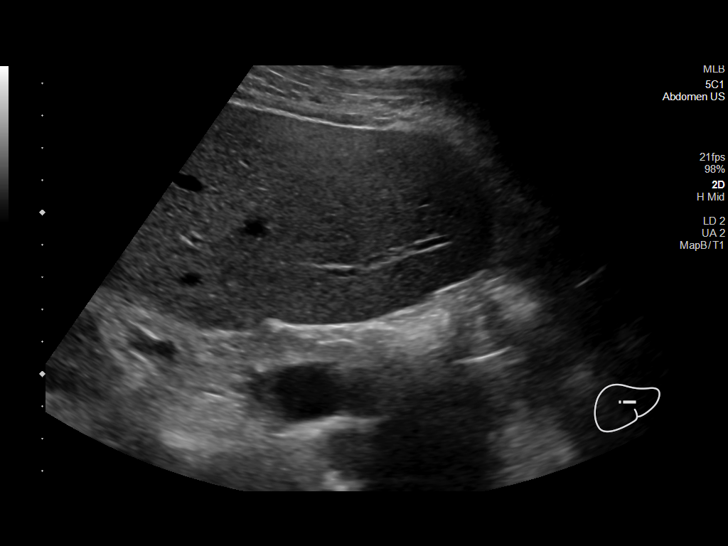
[im 39/78]
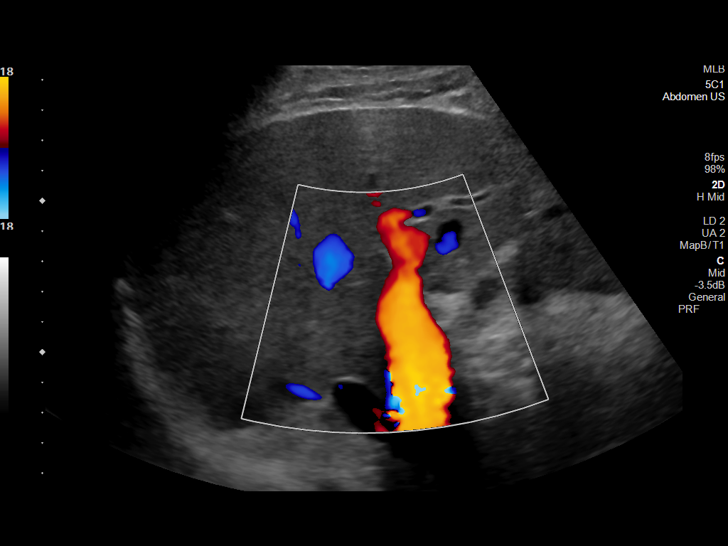
[im 45/78]
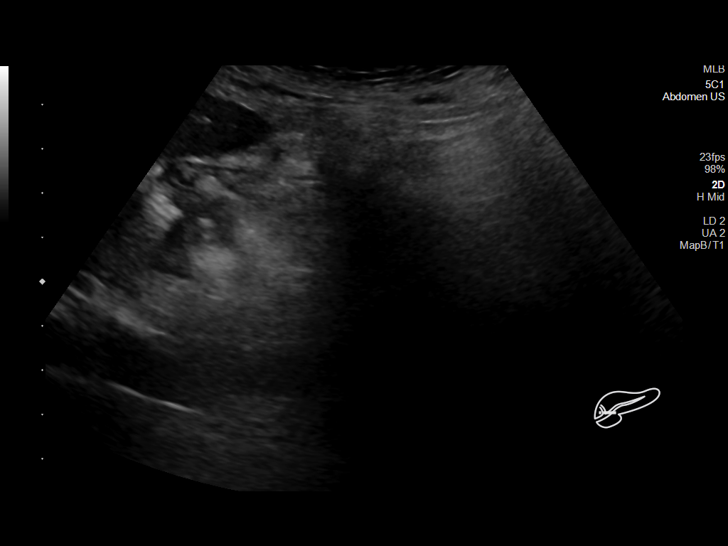
[im 52/78]
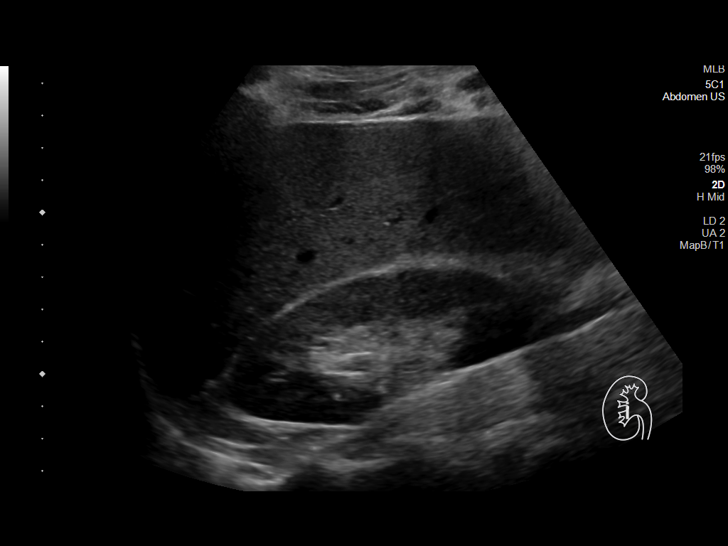
[im 58/78]
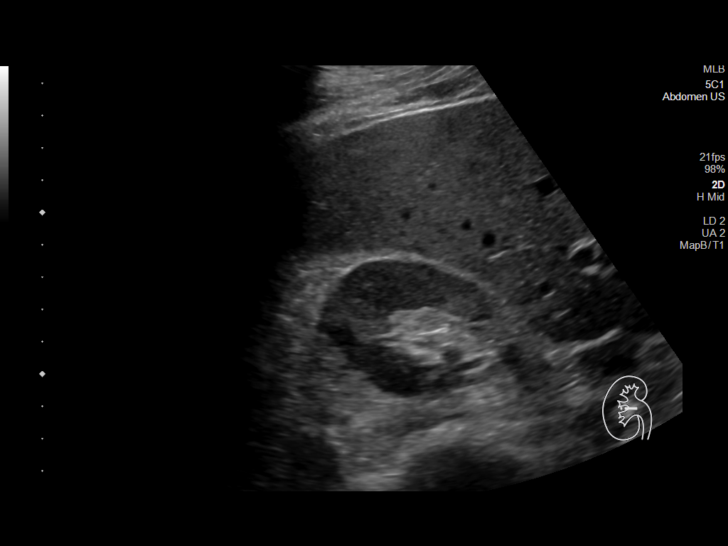
[im 65/78]
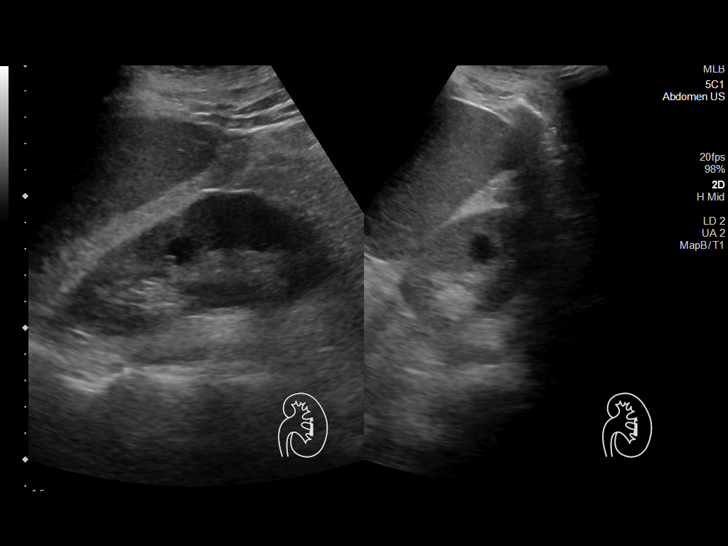
[im 71/78]
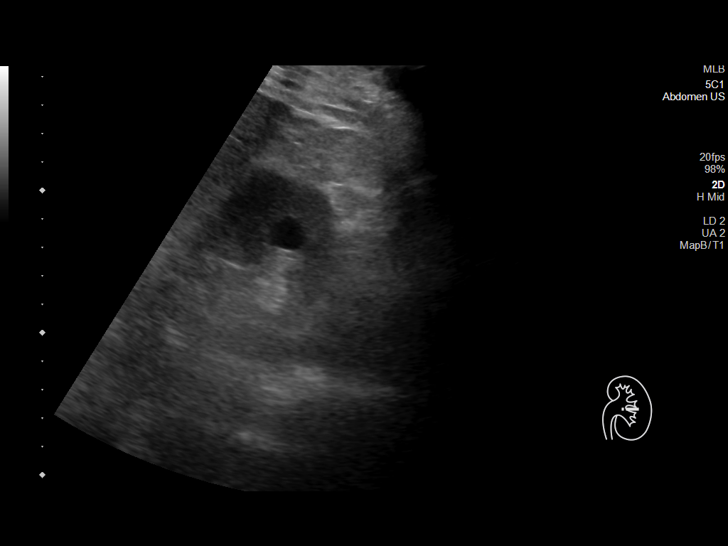
[im 78/78]
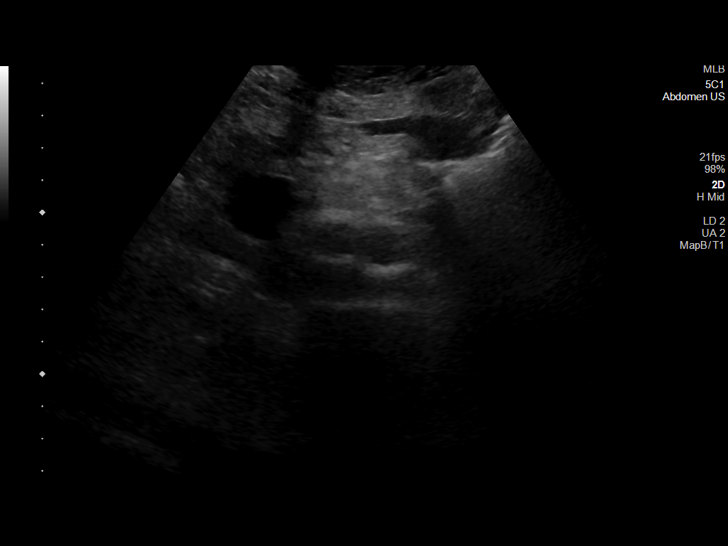

[13 of 25 positions shown; findings below may reference images not displayed]

FINDINGS: Gallbladder: Physiologically distended. No gallstones or wall
thickening visualized. No sonographic Murphy sign noted by
sonographer.

Common bile duct: Diameter: 4 mm, normal.

Liver: No focal lesion identified. Within normal limits in
parenchymal echogenicity. Portal vein is patent on color Doppler
imaging with normal direction of blood flow towards the liver.

IVC: No abnormality visualized.

Pancreas: Obscured by bowel gas.

Spleen: Enlarged measuring 13.6 x 13.7 x 6.7 cm for a volume of 648
cc. No focal abnormality.

Right Kidney: Length: 10.2 cm. Echogenicity within normal limits. No
mass or hydronephrosis visualized. No visualized stone.

Left Kidney: Length: 10.9 cm. Normal parenchymal echogenicity. No
hydronephrosis. Three simple cysts largest measuring 1.3 cm. No
solid lesion. No visualized stone.

Abdominal aorta: No aneurysm visualized. Portions are obscured by
bowel gas.

Other findings: No abdominal ascites.
IMPRESSION: 1. Splenomegaly with splenic volume of 648 cc. No focal splenic
abnormality.
2. Incidental left renal cysts.
3. Otherwise unremarkable abdominal ultrasound. Midline structures
including the pancreas and portions of the aorta are obscured.

## 2022-06-23 ENCOUNTER — Ambulatory Visit (HOSPITAL_BASED_OUTPATIENT_CLINIC_OR_DEPARTMENT_OTHER)
Admission: RE | Admit: 2022-06-23 | Discharge: 2022-06-23 | Disposition: A | Discharge status: Home / Self Care | Payer: Medicare PPO | Source: Ambulatory Visit | Attending: Acute Care | Admitting: Acute Care

## 2022-06-23 DIAGNOSIS — I700 Atherosclerosis of aorta: Secondary | ICD-10-CM | POA: Insufficient documentation

## 2022-06-23 DIAGNOSIS — J432 Centrilobular emphysema: Secondary | ICD-10-CM | POA: Insufficient documentation

## 2022-06-23 DIAGNOSIS — I2510 Atherosclerotic heart disease of native coronary artery without angina pectoris: Secondary | ICD-10-CM | POA: Diagnosis not present

## 2022-06-23 DIAGNOSIS — Z87891 Personal history of nicotine dependence: Secondary | ICD-10-CM

## 2022-06-23 DIAGNOSIS — F17210 Nicotine dependence, cigarettes, uncomplicated: Secondary | ICD-10-CM | POA: Diagnosis not present

## 2022-06-23 DIAGNOSIS — Z122 Encounter for screening for malignant neoplasm of respiratory organs: Secondary | ICD-10-CM | POA: Diagnosis not present

## 2022-06-26 ENCOUNTER — Other Ambulatory Visit: Payer: Self-pay | Admitting: Acute Care

## 2022-06-26 DIAGNOSIS — Z87891 Personal history of nicotine dependence: Secondary | ICD-10-CM

## 2022-06-26 DIAGNOSIS — F17210 Nicotine dependence, cigarettes, uncomplicated: Secondary | ICD-10-CM

## 2022-06-26 DIAGNOSIS — Z122 Encounter for screening for malignant neoplasm of respiratory organs: Secondary | ICD-10-CM

## 2022-06-27 ENCOUNTER — Telehealth: Payer: Self-pay | Admitting: Acute Care

## 2022-06-27 NOTE — Telephone Encounter (Signed)
Spoke with patient by phone (using two identifiers) to review results of LDCT.  No suspicious findings for lung cancer.  Emphysema and atherosclerosis, as previously noted.  Calcifications of aortic valve. Patient is on statin medication but has not had other cardiac studies, such as echocardiogram.  Advised that we will route results to PCP and copy her on this note.  PCP can review and make recommendations if additional testing is needed for valve calcifications.  Patient acknowledged understanding and had no further questions.  Will place order for 2024 annual LDCT.

## 2022-07-10 NOTE — Telephone Encounter (Signed)
Shane Adams can you call him and move up our follow up to next week if he is able.   -

## 2022-07-24 ENCOUNTER — Inpatient Hospital Stay: Payer: Medicare PPO | Admitting: Oncology

## 2022-07-24 ENCOUNTER — Inpatient Hospital Stay: Payer: Medicare PPO | Attending: Oncology

## 2022-07-24 ENCOUNTER — Encounter: Payer: Self-pay | Admitting: Oncology

## 2022-07-24 VITALS — BP 112/58 | HR 60 | Temp 98.2°F | Resp 18 | Ht 72.0 in | Wt 152.0 lb

## 2022-07-24 DIAGNOSIS — F419 Anxiety disorder, unspecified: Secondary | ICD-10-CM | POA: Insufficient documentation

## 2022-07-24 DIAGNOSIS — K219 Gastro-esophageal reflux disease without esophagitis: Secondary | ICD-10-CM | POA: Diagnosis not present

## 2022-07-24 DIAGNOSIS — D473 Essential (hemorrhagic) thrombocythemia: Secondary | ICD-10-CM | POA: Diagnosis not present

## 2022-07-24 DIAGNOSIS — I10 Essential (primary) hypertension: Secondary | ICD-10-CM | POA: Diagnosis not present

## 2022-07-24 DIAGNOSIS — E1159 Type 2 diabetes mellitus with other circulatory complications: Secondary | ICD-10-CM | POA: Diagnosis not present

## 2022-07-24 DIAGNOSIS — F32A Depression, unspecified: Secondary | ICD-10-CM | POA: Insufficient documentation

## 2022-07-24 DIAGNOSIS — D75839 Thrombocytosis, unspecified: Secondary | ICD-10-CM | POA: Diagnosis not present

## 2022-07-24 DIAGNOSIS — J449 Chronic obstructive pulmonary disease, unspecified: Secondary | ICD-10-CM | POA: Diagnosis not present

## 2022-07-24 DIAGNOSIS — E7800 Pure hypercholesterolemia, unspecified: Secondary | ICD-10-CM | POA: Insufficient documentation

## 2022-07-24 LAB — CBC WITH DIFFERENTIAL (CANCER CENTER ONLY)
Abs Immature Granulocytes: 0.18 10*3/uL — ABNORMAL HIGH (ref 0.00–0.07)
Basophils Absolute: 0.2 10*3/uL — ABNORMAL HIGH (ref 0.0–0.1)
Basophils Relative: 2 %
Eosinophils Absolute: 0.4 10*3/uL (ref 0.0–0.5)
Eosinophils Relative: 3 %
HCT: 33.3 % — ABNORMAL LOW (ref 39.0–52.0)
Hemoglobin: 10.7 g/dL — ABNORMAL LOW (ref 13.0–17.0)
Immature Granulocytes: 1 %
Lymphocytes Relative: 12 %
Lymphs Abs: 1.5 10*3/uL (ref 0.7–4.0)
MCH: 29.3 pg (ref 26.0–34.0)
MCHC: 32.1 g/dL (ref 30.0–36.0)
MCV: 91.2 fL (ref 80.0–100.0)
Monocytes Absolute: 0.7 10*3/uL (ref 0.1–1.0)
Monocytes Relative: 6 %
Neutro Abs: 9.9 10*3/uL — ABNORMAL HIGH (ref 1.7–7.7)
Neutrophils Relative %: 76 %
Platelet Count: 739 10*3/uL — ABNORMAL HIGH (ref 150–400)
RBC: 3.65 MIL/uL — ABNORMAL LOW (ref 4.22–5.81)
RDW: 16.2 % — ABNORMAL HIGH (ref 11.5–15.5)
WBC Count: 13 10*3/uL — ABNORMAL HIGH (ref 4.0–10.5)
nRBC: 0 % (ref 0.0–0.2)

## 2022-07-24 NOTE — Progress Notes (Signed)
  [place] OFFICE PROGRESS NOTE   Diagnosis: Essential thrombocytosis  INTERVAL HISTORY:   Mr Shane Adams returns as scheduled.  No bleeding or symptom of thrombosis.  Reports having a "flu "beginning 07/04/2022.  No bleeding.  Objective:  Vital signs in last 24 hours:  Blood pressure (!) 112/58, pulse 60, temperature 98.2 F (36.8 C), temperature source Oral, resp. rate 18, height 6' (1.829 m), weight 152 lb (68.9 kg), SpO2 100 %.      Resp: Distant breath sounds, no respiratory distress Cardio: Regular rhythm with premature beats GI: No hepatosplenomegaly Vascular: No leg edema    Lab Results:  Lab Results  Component Value Date   WBC 13.0 (H) 07/24/2022   HGB 10.7 (L) 07/24/2022   HCT 33.3 (L) 07/24/2022   MCV 91.2 07/24/2022   PLT 739 (H) 07/24/2022   NEUTROABS 9.9 (H) 07/24/2022    CMP  Lab Results  Component Value Date   NA 140 02/13/2022   K 5.4 No hemolysis seen (H) 02/13/2022   CL 105 02/13/2022   CO2 27 02/13/2022   GLUCOSE 109 (H) 02/13/2022   BUN 24 (H) 02/13/2022   CREATININE 1.43 02/13/2022   CALCIUM 9.5 02/13/2022   PROT 6.4 02/13/2022   ALBUMIN 4.2 02/13/2022   AST 14 02/13/2022   ALT 12 02/13/2022   ALKPHOS 73 02/13/2022   BILITOT 0.6 02/13/2022   GFRNONAA 57 (L) 01/06/2022   GFRAA 90 09/24/2007    Medications: I have reviewed the patient's current medications.   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 Ultrasound abdomen 03/17/2021-splenomegaly Hydroxyurea 500 mg daily on Monday-Friday beginning 03/28/2021 Hydroxyurea 500 mg daily beginning 04/15/2021 Hydroxyurea 1000 mg Monday, Wednesday, and Friday, 500 mg other days 09/09/2021 Hydroxyurea 1000 mg Monday-Friday, 500 mg Saturday/Sunday, 10/21/2021 Hydroxyurea placed on hold 12/02/2021 Weight loss COPD Tobacco use Hypertension Diabetes Hypercholesterolemia Reflux Depression/anxiety    Disposition: Mr. Schmutz appears stable.  He  has persistent thrombocytosis.  He agrees to resume hydroxyurea.  He will begin hydroxyurea at a dose of 500 mg daily. The hemoglobin is lower today.  Dr. Quay Burow will check a CBC when she sees him on 08/11/2022.  Mr. Gonzaga will return for an office visit on 08/25/2022.    Betsy Coder, MD  07/24/2022  8:21 AM

## 2022-07-24 NOTE — Progress Notes (Deleted)
  [place] OFFICE PROGRESS NOTE   Diagnosis:   INTERVAL HISTORY:   ***  Objective:  Vital signs in last 24 hours:  Blood pressure (!) 112/58, pulse 60, temperature 98.2 F (36.8 C), temperature source Oral, resp. rate 18, height 6' (1.829 m), weight 152 lb (68.9 kg), SpO2 100 %.    HEENT: *** Lymphatics: *** Resp: *** Cardio: *** GI: *** Vascular: *** Neuro:***  Skin:***   Portacath/PICC-without erythema  Lab Results:  Lab Results  Component Value Date   WBC 13.0 (H) 07/24/2022   HGB 10.7 (L) 07/24/2022   HCT 33.3 (L) 07/24/2022   MCV 91.2 07/24/2022   PLT 739 (H) 07/24/2022   NEUTROABS 9.9 (H) 07/24/2022    CMP  Lab Results  Component Value Date   NA 140 02/13/2022   K 5.4 No hemolysis seen (H) 02/13/2022   CL 105 02/13/2022   CO2 27 02/13/2022   GLUCOSE 109 (H) 02/13/2022   BUN 24 (H) 02/13/2022   CREATININE 1.43 02/13/2022   CALCIUM 9.5 02/13/2022   PROT 6.4 02/13/2022   ALBUMIN 4.2 02/13/2022   AST 14 02/13/2022   ALT 12 02/13/2022   ALKPHOS 73 02/13/2022   BILITOT 0.6 02/13/2022   GFRNONAA 57 (L) 01/06/2022   GFRAA 90 09/24/2007     Medications: I have reviewed the patient's current medications.   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 Ultrasound abdomen 03/17/2021-splenomegaly Hydroxyurea 500 mg daily on Monday-Friday beginning 03/28/2021 Hydroxyurea 500 mg daily beginning 04/15/2021 Hydroxyurea 1000 mg Monday, Wednesday, and Friday, 500 mg other days 09/09/2021 Hydroxyurea 1000 mg Monday-Friday, 500 mg Saturday/Sunday, 10/21/2021 Hydroxyurea placed on hold 12/02/2021 Weight loss COPD Tobacco use Hypertension Diabetes Hypercholesterolemia Reflux Depression/anxiety     Disposition: ***  Betsy Coder, MD  07/24/2022  8:25 AM

## 2022-08-02 ENCOUNTER — Telehealth: Payer: Self-pay | Admitting: *Deleted

## 2022-08-02 MED ORDER — HYDROXYUREA 500 MG PO CAPS: 500.0000 mg | ORAL_CAPSULE | Freq: Every day | ORAL | 0 refills | Status: DC

## 2022-08-02 NOTE — Telephone Encounter (Signed)
Wife called to request refill on Hydrea 500 mg to resume after MD visit on 1/15.

## 2022-08-02 NOTE — Telephone Encounter (Signed)
Mrs. Shane Adams called to request [facility] script that MD was to send in on 07/24/22 and that [facility] had sent refill request without answer. Made her aware we have not received refill request from [place]. Nurse sent script today.

## 2022-08-10 ENCOUNTER — Encounter: Payer: Self-pay | Admitting: Internal Medicine

## 2022-08-10 NOTE — Progress Notes (Signed)
Subjective:    Patient ID: Shane Adams, male    DOB: December 08, 1955, 67 y.o.   MRN: [id number]     HPI Shane Adams is here for follow up of his chronic medical problems, including DM, htn, HLD, thrombocytosis  mild CKD- Rarely taking ibuprofen.  Not drinking enough water  Medications and allergies reviewed with patient and updated if appropriate.  Current Outpatient Medications on File Prior to Visit  Medication Sig Dispense Refill   aspirin EC 81 MG tablet Take 1 tablet (81 mg total) by mouth daily.     atorvastatin (LIPITOR) 20 MG tablet Take 1 tablet (20 mg total) by mouth daily. 90 tablet 2   FLUoxetine (PROZAC) 40 MG capsule Take 1 capsule (40 mg total) by mouth daily. 90 capsule 3   fluticasone-salmeterol (ADVAIR) 100-50 MCG/ACT AEPB INHALE 1 DOSE BY MOUTH EVERY 12 HOURS 60 each 0   hydroxyurea (HYDREA) 500 MG capsule Take 1 capsule (500 mg total) by mouth daily. May take with food to minimize GI side effects. 90 capsule 0   lisinopril (ZESTRIL) 5 MG tablet Take 1 tablet (5 mg total) by mouth daily. 90 tablet 3   metFORMIN (GLUCOPHAGE) 500 MG tablet 1/2 tablet daily with largest meal 45 tablet 3   Multiple Vitamin (MULTIVITAMIN) tablet Take 1 tablet by mouth daily.     No current facility-administered medications on file prior to visit.     Review of Systems  Constitutional:  Negative for chills and fever.  Respiratory:  Positive for cough, shortness of breath (on exertion) and wheezing.   Cardiovascular:  Negative for chest pain, palpitations and leg swelling.  Gastrointestinal:  Negative for abdominal pain and nausea.       Gerd  Neurological:  Negative for light-headedness and headaches.       Objective:   Vitals:   08/11/22 0752  BP: 110/62  Pulse: 68  Temp: 98.1 F (36.7 C)  SpO2: 95%   BP Readings from Last 3 Encounters:  08/11/22 110/62  07/24/22 (!) 112/58  03/31/22 97/69   Wt Readings from Last 3 Encounters:  08/11/22 153 lb (69.4 kg)   07/24/22 152 lb (68.9 kg)  03/31/22 152 lb 6.4 oz (69.1 kg)   Body mass index is 20.75 kg/m.    Physical Exam Constitutional:      General: He is not in acute distress.    Appearance: Normal appearance. He is not ill-appearing.  HENT:     Head: Normocephalic and atraumatic.  Eyes:     Conjunctiva/sclera: Conjunctivae normal.  Cardiovascular:     Rate and Rhythm: Normal rate and regular rhythm.     Heart sounds: Normal heart sounds. No murmur heard. Pulmonary:     Effort: Pulmonary effort is normal. No respiratory distress.     Breath sounds: Normal breath sounds. No wheezing or rales.  Musculoskeletal:     Right lower leg: No edema.     Left lower leg: No edema.  Skin:    General: Skin is warm and dry.     Findings: No rash.  Neurological:     Mental Status: He is alert. Mental status is at baseline.  Psychiatric:        Mood and Affect: Mood normal.        Lab Results  Component Value Date   WBC 13.0 (H) 07/24/2022   HGB 10.7 (L) 07/24/2022   HCT 33.3 (L) 07/24/2022   PLT 739 (H) 07/24/2022   GLUCOSE 109 (  H) 02/13/2022   CHOL 97 02/13/2022   TRIG 73.0 02/13/2022   HDL 44.10 02/13/2022   LDLDIRECT 142.6 10/13/2010   LDLCALC 38 02/13/2022   ALT 12 02/13/2022   AST 14 02/13/2022   NA 140 02/13/2022   K 5.4 No hemolysis seen (H) 02/13/2022   CL 105 02/13/2022   CREATININE 1.43 02/13/2022   BUN 24 (H) 02/13/2022   CO2 27 02/13/2022   TSH 3.09 02/08/2021   PSA 2.46 02/13/2022   HGBA1C 5.9 02/13/2022   MICROALBUR 1.3 01/14/2016     Assessment & Plan:    See Problem List for Assessment and Plan of chronic medical problems.

## 2022-08-10 NOTE — Patient Instructions (Addendum)
      Blood work was ordered.   The lab is on the first floor.    Medications changes include :   none     Return in about 6 months (around 02/09/2023) for Physical Exam.

## 2022-08-11 ENCOUNTER — Other Ambulatory Visit: Payer: Self-pay

## 2022-08-11 ENCOUNTER — Ambulatory Visit: Payer: Medicare PPO | Admitting: Internal Medicine

## 2022-08-11 ENCOUNTER — Telehealth: Payer: Self-pay

## 2022-08-11 VITALS — BP 110/62 | HR 68 | Temp 98.1°F | Ht 72.0 in | Wt 153.0 lb

## 2022-08-11 DIAGNOSIS — E782 Mixed hyperlipidemia: Secondary | ICD-10-CM

## 2022-08-11 DIAGNOSIS — E119 Type 2 diabetes mellitus without complications: Secondary | ICD-10-CM

## 2022-08-11 DIAGNOSIS — D75839 Thrombocytosis, unspecified: Secondary | ICD-10-CM

## 2022-08-11 DIAGNOSIS — F32A Depression, unspecified: Secondary | ICD-10-CM

## 2022-08-11 DIAGNOSIS — I10 Essential (primary) hypertension: Secondary | ICD-10-CM

## 2022-08-11 DIAGNOSIS — F419 Anxiety disorder, unspecified: Secondary | ICD-10-CM

## 2022-08-11 DIAGNOSIS — R944 Abnormal results of kidney function studies: Secondary | ICD-10-CM | POA: Insufficient documentation

## 2022-08-11 LAB — MICROALBUMIN / CREATININE URINE RATIO
Creatinine,U: 168 mg/dL
Microalb Creat Ratio: 1.2 mg/g (ref 0.0–30.0)
Microalb, Ur: 2.1 mg/dL — ABNORMAL HIGH (ref 0.0–1.9)

## 2022-08-11 LAB — CBC WITH DIFFERENTIAL/PLATELET
Basophils Absolute: 0.4 10*3/uL — ABNORMAL HIGH (ref 0.0–0.1)
Basophils Relative: 3.1 % — ABNORMAL HIGH (ref 0.0–3.0)
Eosinophils Absolute: 0.6 10*3/uL (ref 0.0–0.7)
Eosinophils Relative: 4.6 % (ref 0.0–5.0)
HCT: 38.1 % — ABNORMAL LOW (ref 39.0–52.0)
Hemoglobin: 12.7 g/dL — ABNORMAL LOW (ref 13.0–17.0)
Lymphocytes Relative: 13 % (ref 12.0–46.0)
Lymphs Abs: 1.7 10*3/uL (ref 0.7–4.0)
MCHC: 33.4 g/dL (ref 30.0–36.0)
MCV: 91.2 fl (ref 78.0–100.0)
Monocytes Absolute: 0.7 10*3/uL (ref 0.1–1.0)
Monocytes Relative: 5.5 % (ref 3.0–12.0)
Neutro Abs: 9.4 10*3/uL — ABNORMAL HIGH (ref 1.4–7.7)
Neutrophils Relative %: 73.8 % (ref 43.0–77.0)
Platelets: 883 10*3/uL — ABNORMAL HIGH (ref 150.0–400.0)
RBC: 4.18 Mil/uL — ABNORMAL LOW (ref 4.22–5.81)
RDW: 19.9 % — ABNORMAL HIGH (ref 11.5–15.5)
WBC: 12.8 10*3/uL — ABNORMAL HIGH (ref 4.0–10.5)

## 2022-08-11 LAB — COMPREHENSIVE METABOLIC PANEL
ALT: 10 U/L (ref 0–53)
AST: 14 U/L (ref 0–37)
Albumin: 3.8 g/dL (ref 3.5–5.2)
Alkaline Phosphatase: 69 U/L (ref 39–117)
BUN: 25 mg/dL — ABNORMAL HIGH (ref 6–23)
CO2: 29 mEq/L (ref 19–32)
Calcium: 8.9 mg/dL (ref 8.4–10.5)
Chloride: 102 mEq/L (ref 96–112)
Creatinine, Ser: 1.33 mg/dL (ref 0.40–1.50)
GFR: 54.96 mL/min — ABNORMAL LOW (ref >60.00)
Glucose, Bld: 103 mg/dL — ABNORMAL HIGH (ref 70–99)
Potassium: 4.8 mEq/L (ref 3.5–5.1)
Sodium: 139 mEq/L (ref 135–145)
Total Bilirubin: 0.5 mg/dL (ref 0.2–1.2)
Total Protein: 6.5 g/dL (ref 6.0–8.3)

## 2022-08-11 LAB — LIPID PANEL
Cholesterol: 121 mg/dL (ref 0–200)
HDL: 46.8 mg/dL (ref >39.00)
LDL Cholesterol: 62 mg/dL (ref 0–99)
NonHDL: 74.1
Total CHOL/HDL Ratio: 3
Triglycerides: 60 mg/dL (ref 0.0–149.0)
VLDL: 12 mg/dL (ref 0.0–40.0)

## 2022-08-11 LAB — HEMOGLOBIN A1C: Hgb A1c MFr Bld: 5.9 % (ref 4.6–6.5)

## 2022-08-11 NOTE — Assessment & Plan Note (Signed)
Chronic   Lab Results  Component Value Date   HGBA1C 5.9 02/13/2022   Sugars well controlled Check A1c, urine microalbumin today Continue metformin 250 mg daily Stressed regular exercise, diabetic diet

## 2022-08-11 NOTE — Telephone Encounter (Signed)
-----   Message from Ladell Pier, MD sent at 08/11/2022  2:58 PM EST ----- Please call patient platelets remain high, continue hydroxyurea, we will increase the hydroxyurea dose if the platelet count is not lower when he returns in 2 weeks

## 2022-08-11 NOTE — Assessment & Plan Note (Signed)
Chronic Blood pressure well controlled CMP Continue lisinopril 5 mg daily

## 2022-08-11 NOTE — Assessment & Plan Note (Signed)
Chronic Controlled, Stable Continue fluoxetine 40 mg daily

## 2022-08-11 NOTE — Assessment & Plan Note (Signed)
Chronic Had few tests with low GFR Rarely takes nsaids Increase water intake  CBC, CMP, urine microalbumin Blood pressure and sugars well-controlled ?  Consider Wilder Glade

## 2022-08-11 NOTE — Telephone Encounter (Signed)
Patient gave verbal understanding and had no questions or concerns.

## 2022-08-11 NOTE — Assessment & Plan Note (Signed)
Chronic Following with hematology/oncology On hydroxyurea 500 mg daily CBC today

## 2022-08-11 NOTE — Assessment & Plan Note (Signed)
Chronic °Regular exercise and healthy diet encouraged °Check lipid panel  °Continue atorvastatin 20 mg daily °

## 2022-08-25 ENCOUNTER — Encounter: Payer: Self-pay | Admitting: *Deleted

## 2022-08-25 ENCOUNTER — Inpatient Hospital Stay: Payer: Medicare PPO | Attending: Oncology

## 2022-08-25 DIAGNOSIS — I10 Essential (primary) hypertension: Secondary | ICD-10-CM | POA: Diagnosis not present

## 2022-08-25 DIAGNOSIS — D473 Essential (hemorrhagic) thrombocythemia: Secondary | ICD-10-CM | POA: Insufficient documentation

## 2022-08-25 DIAGNOSIS — K219 Gastro-esophageal reflux disease without esophagitis: Secondary | ICD-10-CM | POA: Insufficient documentation

## 2022-08-25 DIAGNOSIS — F32A Depression, unspecified: Secondary | ICD-10-CM | POA: Insufficient documentation

## 2022-08-25 DIAGNOSIS — E1159 Type 2 diabetes mellitus with other circulatory complications: Secondary | ICD-10-CM | POA: Insufficient documentation

## 2022-08-25 DIAGNOSIS — J449 Chronic obstructive pulmonary disease, unspecified: Secondary | ICD-10-CM | POA: Insufficient documentation

## 2022-08-25 DIAGNOSIS — D75839 Thrombocytosis, unspecified: Secondary | ICD-10-CM

## 2022-08-25 DIAGNOSIS — F419 Anxiety disorder, unspecified: Secondary | ICD-10-CM | POA: Diagnosis not present

## 2022-08-25 LAB — CBC WITH DIFFERENTIAL (CANCER CENTER ONLY)
Abs Immature Granulocytes: 0.12 10*3/uL — ABNORMAL HIGH (ref 0.00–0.07)
Basophils Absolute: 0.2 10*3/uL — ABNORMAL HIGH (ref 0.0–0.1)
Basophils Relative: 1 %
Eosinophils Absolute: 0.7 10*3/uL — ABNORMAL HIGH (ref 0.0–0.5)
Eosinophils Relative: 6 %
HCT: 38.7 % — ABNORMAL LOW (ref 39.0–52.0)
Hemoglobin: 12.7 g/dL — ABNORMAL LOW (ref 13.0–17.0)
Immature Granulocytes: 1 %
Lymphocytes Relative: 15 %
Lymphs Abs: 1.8 10*3/uL (ref 0.7–4.0)
MCH: 30.6 pg (ref 26.0–34.0)
MCHC: 32.8 g/dL (ref 30.0–36.0)
MCV: 93.3 fL (ref 80.0–100.0)
Monocytes Absolute: 0.7 10*3/uL (ref 0.1–1.0)
Monocytes Relative: 6 %
Neutro Abs: 8.3 10*3/uL — ABNORMAL HIGH (ref 1.7–7.7)
Neutrophils Relative %: 71 %
Platelet Count: 700 10*3/uL — ABNORMAL HIGH (ref 150–400)
RBC: 4.15 MIL/uL — ABNORMAL LOW (ref 4.22–5.81)
RDW: 19.8 % — ABNORMAL HIGH (ref 11.5–15.5)
WBC Count: 11.8 10*3/uL — ABNORMAL HIGH (ref 4.0–10.5)
nRBC: 0 % (ref 0.0–0.2)

## 2022-09-01 ENCOUNTER — Inpatient Hospital Stay: Payer: Medicare PPO

## 2022-09-01 ENCOUNTER — Encounter: Payer: Self-pay | Admitting: Oncology

## 2022-09-01 ENCOUNTER — Inpatient Hospital Stay: Payer: Medicare PPO | Admitting: Oncology

## 2022-09-01 VITALS — BP 105/62 | HR 67 | Temp 98.3°F | Resp 18 | Ht 72.0 in | Wt 149.0 lb

## 2022-09-01 DIAGNOSIS — D75839 Thrombocytosis, unspecified: Secondary | ICD-10-CM | POA: Diagnosis not present

## 2022-09-01 DIAGNOSIS — K219 Gastro-esophageal reflux disease without esophagitis: Secondary | ICD-10-CM | POA: Diagnosis not present

## 2022-09-01 DIAGNOSIS — I10 Essential (primary) hypertension: Secondary | ICD-10-CM | POA: Diagnosis not present

## 2022-09-01 DIAGNOSIS — J449 Chronic obstructive pulmonary disease, unspecified: Secondary | ICD-10-CM | POA: Diagnosis not present

## 2022-09-01 DIAGNOSIS — F419 Anxiety disorder, unspecified: Secondary | ICD-10-CM | POA: Diagnosis not present

## 2022-09-01 DIAGNOSIS — E1159 Type 2 diabetes mellitus with other circulatory complications: Secondary | ICD-10-CM | POA: Diagnosis not present

## 2022-09-01 DIAGNOSIS — D473 Essential (hemorrhagic) thrombocythemia: Secondary | ICD-10-CM | POA: Diagnosis not present

## 2022-09-01 DIAGNOSIS — F32A Depression, unspecified: Secondary | ICD-10-CM | POA: Diagnosis not present

## 2022-09-01 LAB — CBC WITH DIFFERENTIAL (CANCER CENTER ONLY)
Abs Immature Granulocytes: 0.12 10*3/uL — ABNORMAL HIGH (ref 0.00–0.07)
Basophils Absolute: 0.2 10*3/uL — ABNORMAL HIGH (ref 0.0–0.1)
Basophils Relative: 2 %
Eosinophils Absolute: 0.5 10*3/uL (ref 0.0–0.5)
Eosinophils Relative: 5 %
HCT: 38.4 % — ABNORMAL LOW (ref 39.0–52.0)
Hemoglobin: 12.9 g/dL — ABNORMAL LOW (ref 13.0–17.0)
Immature Granulocytes: 1 %
Lymphocytes Relative: 16 %
Lymphs Abs: 1.7 10*3/uL (ref 0.7–4.0)
MCH: 31.2 pg (ref 26.0–34.0)
MCHC: 33.6 g/dL (ref 30.0–36.0)
MCV: 92.8 fL (ref 80.0–100.0)
Monocytes Absolute: 0.8 10*3/uL (ref 0.1–1.0)
Monocytes Relative: 7 %
Neutro Abs: 7.2 10*3/uL (ref 1.7–7.7)
Neutrophils Relative %: 69 %
Platelet Count: 725 10*3/uL — ABNORMAL HIGH (ref 150–400)
RBC: 4.14 MIL/uL — ABNORMAL LOW (ref 4.22–5.81)
RDW: 19.7 % — ABNORMAL HIGH (ref 11.5–15.5)
WBC Count: 10.4 10*3/uL (ref 4.0–10.5)
nRBC: 0 % (ref 0.0–0.2)

## 2022-09-01 LAB — FERRITIN: Ferritin: 155 ng/mL (ref 24–336)

## 2022-09-01 NOTE — Progress Notes (Signed)
  [facility] OFFICE PROGRESS NOTE   Diagnosis: Essential thrombocytosis  INTERVAL HISTORY:   Shane Adams returns as scheduled.  He resumed hydroxyurea after the office visit here last month.  He reports tolerating the hydroxyurea well.  No mouth sores, bleeding, or symptoms of thrombosis.  Good appetite.  No new complaint.  Objective:  Vital signs in last 24 hours:  Blood pressure 105/62, pulse 67, temperature 98.3 F (36.8 C), temperature source Oral, resp. rate 18, height 6' (1.829 m), weight 149 lb (67.6 kg), SpO2 99 %.    HEENT: No thrush or ulcers Resp: Distant breath sounds, scattered end inspiratory and expiratory wheezes, no respiratory distress Cardio: Regular rate and rhythm GI: No hepatosplenomegaly Vascular: No leg edema   Lab Results:  Lab Results  Component Value Date   WBC 10.4 09/01/2022   HGB 12.9 (L) 09/01/2022   HCT 38.4 (L) 09/01/2022   MCV 92.8 09/01/2022   PLT 725 (H) 09/01/2022   NEUTROABS 7.2 09/01/2022    CMP  Lab Results  Component Value Date   NA 139 08/11/2022   K 4.8 08/11/2022   CL 102 08/11/2022   CO2 29 08/11/2022   GLUCOSE 103 (H) 08/11/2022   BUN 25 (H) 08/11/2022   CREATININE 1.33 08/11/2022   CALCIUM 8.9 08/11/2022   PROT 6.5 08/11/2022   ALBUMIN 3.8 08/11/2022   AST 14 08/11/2022   ALT 10 08/11/2022   ALKPHOS 69 08/11/2022   BILITOT 0.5 08/11/2022   GFRNONAA 57 (L) 01/06/2022   GFRAA 90 09/24/2007     Medications: I have reviewed the patient's current medications.   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 Ultrasound abdomen 03/17/2021-splenomegaly Hydroxyurea 500 mg daily on Monday-Friday beginning 03/28/2021 Hydroxyurea 500 mg daily beginning 04/15/2021 Hydroxyurea 1000 mg Monday, Wednesday, and Friday, 500 mg other days 09/09/2021 Hydroxyurea 1000 mg Monday-Friday, 500 mg Saturday/Sunday, 10/21/2021 Hydroxyurea placed on hold 12/02/2021 Hydroxyurea resumed-500 mg  daily 07/24/2022 Hydroxyurea increased to 1000 mg Monday and Thursday, 500 mg other days 09/01/2022 Weight loss COPD Tobacco use Hypertension Diabetes Hypercholesterolemia Reflux Depression/anxiety      Disposition: Shane. Kaluza appears stable.  The platelet count remains above goal range.  He will increase the hydroxyurea to 1000 mg on Monday and Thursday.  He will continue hydroxyurea at a dose of 500 mg on other days.  He will return for an office visit and CBC on 10/13/2022.  We will increase the hydroxyurea dose as indicated.  Betsy Coder, MD  09/01/2022  8:27 AM

## 2022-10-10 ENCOUNTER — Other Ambulatory Visit: Payer: Self-pay | Admitting: Internal Medicine

## 2022-10-13 ENCOUNTER — Telehealth: Payer: Self-pay

## 2022-10-13 ENCOUNTER — Inpatient Hospital Stay: Payer: Medicare PPO | Attending: Oncology | Admitting: Nurse Practitioner

## 2022-10-13 ENCOUNTER — Inpatient Hospital Stay: Payer: Medicare PPO

## 2022-10-13 ENCOUNTER — Encounter: Payer: Self-pay | Admitting: Nurse Practitioner

## 2022-10-13 VITALS — BP 99/64 | HR 60 | Temp 98.2°F | Resp 18 | Ht 72.0 in | Wt 152.2 lb

## 2022-10-13 DIAGNOSIS — D473 Essential (hemorrhagic) thrombocythemia: Secondary | ICD-10-CM | POA: Insufficient documentation

## 2022-10-13 DIAGNOSIS — D75839 Thrombocytosis, unspecified: Secondary | ICD-10-CM | POA: Diagnosis not present

## 2022-10-13 LAB — CBC WITH DIFFERENTIAL (CANCER CENTER ONLY)
Abs Immature Granulocytes: 0.05 10*3/uL (ref 0.00–0.07)
Basophils Absolute: 0.2 10*3/uL — ABNORMAL HIGH (ref 0.0–0.1)
Basophils Relative: 2 %
Eosinophils Absolute: 0.4 10*3/uL (ref 0.0–0.5)
Eosinophils Relative: 4 %
HCT: 39.3 % (ref 39.0–52.0)
Hemoglobin: 12.9 g/dL — ABNORMAL LOW (ref 13.0–17.0)
Immature Granulocytes: 0 %
Lymphocytes Relative: 16 %
Lymphs Abs: 1.8 10*3/uL (ref 0.7–4.0)
MCH: 32.6 pg (ref 26.0–34.0)
MCHC: 32.8 g/dL (ref 30.0–36.0)
MCV: 99.2 fL (ref 80.0–100.0)
Monocytes Absolute: 0.8 10*3/uL (ref 0.1–1.0)
Monocytes Relative: 7 %
Neutro Abs: 8 10*3/uL — ABNORMAL HIGH (ref 1.7–7.7)
Neutrophils Relative %: 71 %
Platelet Count: 747 10*3/uL — ABNORMAL HIGH (ref 150–400)
RBC: 3.96 MIL/uL — ABNORMAL LOW (ref 4.22–5.81)
RDW: 19.4 % — ABNORMAL HIGH (ref 11.5–15.5)
WBC Count: 11.2 10*3/uL — ABNORMAL HIGH (ref 4.0–10.5)
nRBC: 0 % (ref 0.0–0.2)

## 2022-10-13 MED ORDER — HYDROXYUREA 500 MG PO CAPS
1000.0000 mg | ORAL_CAPSULE | Freq: Every day | ORAL | 0 refills | Status: DC
Start: 2022-10-13 — End: 2022-12-08

## 2022-10-13 NOTE — Telephone Encounter (Signed)
-----   Message from Rana Snare, NP sent at 10/13/2022  9:10 AM EDT ----- Please call Shane Adams and his wife.  We had initially discussed increasing the Hydrea to 1000 mg daily.  We decided to go with 1 tablet (500 mg) on Mondays and Thursdays, 2 tablets (1000 mg) all other days.  Lab in 3 weeks.

## 2022-10-13 NOTE — Telephone Encounter (Signed)
I spoke with Shane Adams and his wife to confirm the decision to adjust the dosage of Hydrea to 1000 mg daily. The new regimen will involve taking 1 tablet (500 mg) on Mondays and Thursdays, and 2 tablets (1000 mg) on all other days. We have scheduled a follow-up lab appointment in 3 weeks.

## 2022-10-13 NOTE — Progress Notes (Addendum)
  [facility] OFFICE PROGRESS NOTE   Diagnosis: Essential thrombocytosis  INTERVAL HISTORY:   [name] returns as scheduled.  He continues hydroxyurea.  He denies nausea/vomiting.  No mouth sores.  No diarrhea.  No rash.  Energy level described as "pretty good".  No bleeding.  No symptom of thrombosis.  He confirms current hydroxyurea dose of 1000 mg on Mondays and Thursdays, 500 mg all other days.  Objective:  Vital signs in last 24 hours:  Blood pressure 99/64, pulse 60, temperature 98.2 F (36.8 C), temperature source Oral, resp. rate 18, height 6' (1.829 m), weight 152 lb 3.2 oz (69 kg), SpO2 99 %.    HEENT: No thrush or ulcers. Resp: Distant breath sounds.  Scattered wheezes.  No respiratory distress. Cardio: Regular rate and rhythm. GI: No hepatosplenomegaly. Vascular: No leg edema.   Lab Results:  Lab Results  Component Value Date   WBC 11.2 (H) 10/13/2022   HGB 12.9 (L) 10/13/2022   HCT 39.3 10/13/2022   MCV 99.2 10/13/2022   PLT 747 (H) 10/13/2022   NEUTROABS 8.0 (H) 10/13/2022    Imaging:  No results found.  Medications: I have reviewed the patient's current medications.  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 Ultrasound abdomen 03/17/2021-splenomegaly Hydroxyurea 500 mg daily on Monday-Friday beginning 03/28/2021 Hydroxyurea 500 mg daily beginning 04/15/2021 Hydroxyurea 1000 mg Monday, Wednesday, and Friday, 500 mg other days 09/09/2021 Hydroxyurea 1000 mg Monday-Friday, 500 mg Saturday/Sunday, 10/21/2021 Hydroxyurea placed on hold 12/02/2021 Hydroxyurea resumed-500 mg daily 07/24/2022 Hydroxyurea increased to 1000 mg Monday and Thursday, 500 mg other days 09/01/2022 Hydroxyurea increased to 1000 mg daily 10/13/2022 Weight loss COPD Tobacco use Hypertension Diabetes Hypercholesterolemia Reflux Depression/anxiety  Disposition: Mr. Castrillo is currently maintained on hydroxyurea 1000 mg on Mondays and  Thursdays, 500 mg all other days.  He has been maintained on this dose for about 6 weeks.  The platelet count is higher.  We recommend adjusting the dose to 1000 mg daily except 500 mg on Mondays and Thursdays.  He agrees with this plan.  He will return for follow-up CBC in 3 weeks.  We talked about a follow-up visit in 6 weeks.  He agrees to return for a follow-up visit on 12/08/2022.  He understands we may need to do more lab work prior to that visit based on the CBC result in 3 weeks.    Lonna Cobb ANP/GNP-BC   10/13/2022  8:40 AM

## 2022-11-03 ENCOUNTER — Inpatient Hospital Stay: Payer: Medicare PPO

## 2022-11-03 ENCOUNTER — Telehealth: Payer: Self-pay

## 2022-11-03 ENCOUNTER — Other Ambulatory Visit: Payer: Self-pay

## 2022-11-03 DIAGNOSIS — D473 Essential (hemorrhagic) thrombocythemia: Secondary | ICD-10-CM | POA: Diagnosis not present

## 2022-11-03 DIAGNOSIS — D75839 Thrombocytosis, unspecified: Secondary | ICD-10-CM

## 2022-11-03 LAB — CBC WITH DIFFERENTIAL (CANCER CENTER ONLY)
Abs Immature Granulocytes: 0.04 10*3/uL (ref 0.00–0.07)
Basophils Absolute: 0.2 10*3/uL — ABNORMAL HIGH (ref 0.0–0.1)
Basophils Relative: 2 %
Eosinophils Absolute: 0.3 10*3/uL (ref 0.0–0.5)
Eosinophils Relative: 4 %
HCT: 37.3 % — ABNORMAL LOW (ref 39.0–52.0)
Hemoglobin: 12.1 g/dL — ABNORMAL LOW (ref 13.0–17.0)
Immature Granulocytes: 1 %
Lymphocytes Relative: 19 %
Lymphs Abs: 1.4 10*3/uL (ref 0.7–4.0)
MCH: 33.6 pg (ref 26.0–34.0)
MCHC: 32.4 g/dL (ref 30.0–36.0)
MCV: 103.6 fL — ABNORMAL HIGH (ref 80.0–100.0)
Monocytes Absolute: 0.5 10*3/uL (ref 0.1–1.0)
Monocytes Relative: 6 %
Neutro Abs: 5 10*3/uL (ref 1.7–7.7)
Neutrophils Relative %: 68 %
Platelet Count: 496 10*3/uL — ABNORMAL HIGH (ref 150–400)
RBC: 3.6 MIL/uL — ABNORMAL LOW (ref 4.22–5.81)
RDW: 18.4 % — ABNORMAL HIGH (ref 11.5–15.5)
WBC Count: 7.3 10*3/uL (ref 4.0–10.5)
nRBC: 0 % (ref 0.0–0.2)

## 2022-11-03 NOTE — Telephone Encounter (Signed)
-----   Message from Rana Snare, NP sent at 11/03/2022  9:12 AM EDT ----- Please let him know platelets are better, continue current dose of Hydrea, schedule CBC in 2 weeks.  Thanks

## 2022-11-03 NOTE — Telephone Encounter (Signed)
Patient gave verbal understanding and had no further questions or concerns  

## 2022-11-17 ENCOUNTER — Inpatient Hospital Stay: Payer: Medicare PPO | Attending: Oncology

## 2022-11-17 ENCOUNTER — Telehealth: Payer: Self-pay

## 2022-11-17 DIAGNOSIS — D473 Essential (hemorrhagic) thrombocythemia: Secondary | ICD-10-CM | POA: Diagnosis not present

## 2022-11-17 DIAGNOSIS — E7800 Pure hypercholesterolemia, unspecified: Secondary | ICD-10-CM | POA: Diagnosis not present

## 2022-11-17 DIAGNOSIS — K219 Gastro-esophageal reflux disease without esophagitis: Secondary | ICD-10-CM | POA: Diagnosis not present

## 2022-11-17 DIAGNOSIS — E1159 Type 2 diabetes mellitus with other circulatory complications: Secondary | ICD-10-CM | POA: Diagnosis not present

## 2022-11-17 DIAGNOSIS — F32A Depression, unspecified: Secondary | ICD-10-CM | POA: Insufficient documentation

## 2022-11-17 DIAGNOSIS — J449 Chronic obstructive pulmonary disease, unspecified: Secondary | ICD-10-CM | POA: Diagnosis not present

## 2022-11-17 DIAGNOSIS — I10 Essential (primary) hypertension: Secondary | ICD-10-CM | POA: Diagnosis not present

## 2022-11-17 DIAGNOSIS — F419 Anxiety disorder, unspecified: Secondary | ICD-10-CM | POA: Insufficient documentation

## 2022-11-17 DIAGNOSIS — D75839 Thrombocytosis, unspecified: Secondary | ICD-10-CM

## 2022-11-17 LAB — CBC WITH DIFFERENTIAL (CANCER CENTER ONLY)
Abs Immature Granulocytes: 0.05 10*3/uL (ref 0.00–0.07)
Basophils Absolute: 0.1 10*3/uL (ref 0.0–0.1)
Basophils Relative: 2 %
Eosinophils Absolute: 0.2 10*3/uL (ref 0.0–0.5)
Eosinophils Relative: 3 %
HCT: 38.3 % — ABNORMAL LOW (ref 39.0–52.0)
Hemoglobin: 12.6 g/dL — ABNORMAL LOW (ref 13.0–17.0)
Immature Granulocytes: 1 %
Lymphocytes Relative: 20 %
Lymphs Abs: 1.6 10*3/uL (ref 0.7–4.0)
MCH: 34.6 pg — ABNORMAL HIGH (ref 26.0–34.0)
MCHC: 32.9 g/dL (ref 30.0–36.0)
MCV: 105.2 fL — ABNORMAL HIGH (ref 80.0–100.0)
Monocytes Absolute: 0.5 10*3/uL (ref 0.1–1.0)
Monocytes Relative: 6 %
Neutro Abs: 5.3 10*3/uL (ref 1.7–7.7)
Neutrophils Relative %: 68 %
Platelet Count: 487 10*3/uL — ABNORMAL HIGH (ref 150–400)
RBC: 3.64 MIL/uL — ABNORMAL LOW (ref 4.22–5.81)
RDW: 18.2 % — ABNORMAL HIGH (ref 11.5–15.5)
WBC Count: 7.8 10*3/uL (ref 4.0–10.5)
nRBC: 0 % (ref 0.0–0.2)

## 2022-11-17 NOTE — Telephone Encounter (Signed)
Patient gave verbal understanding and had no further questions or concerns  

## 2022-11-17 NOTE — Telephone Encounter (Signed)
-----   Message from Rana Snare, NP sent at 11/17/2022  9:47 AM EDT ----- Please let him know the platelet count is better, nearing goal range.  Continue same dose of Hydrea and follow-up as scheduled.

## 2022-11-30 ENCOUNTER — Other Ambulatory Visit: Payer: Self-pay | Admitting: *Deleted

## 2022-11-30 DIAGNOSIS — D75839 Thrombocytosis, unspecified: Secondary | ICD-10-CM

## 2022-12-08 ENCOUNTER — Other Ambulatory Visit: Payer: Self-pay | Admitting: *Deleted

## 2022-12-08 ENCOUNTER — Inpatient Hospital Stay: Payer: Medicare PPO

## 2022-12-08 ENCOUNTER — Encounter: Payer: Self-pay | Admitting: Oncology

## 2022-12-08 ENCOUNTER — Inpatient Hospital Stay: Payer: Medicare PPO | Admitting: Oncology

## 2022-12-08 VITALS — BP 103/52 | HR 57 | Temp 98.1°F | Resp 16 | Wt 152.3 lb

## 2022-12-08 DIAGNOSIS — D75839 Thrombocytosis, unspecified: Secondary | ICD-10-CM | POA: Diagnosis not present

## 2022-12-08 DIAGNOSIS — K219 Gastro-esophageal reflux disease without esophagitis: Secondary | ICD-10-CM | POA: Diagnosis not present

## 2022-12-08 DIAGNOSIS — D473 Essential (hemorrhagic) thrombocythemia: Secondary | ICD-10-CM | POA: Diagnosis not present

## 2022-12-08 DIAGNOSIS — E1159 Type 2 diabetes mellitus with other circulatory complications: Secondary | ICD-10-CM | POA: Diagnosis not present

## 2022-12-08 DIAGNOSIS — I10 Essential (primary) hypertension: Secondary | ICD-10-CM | POA: Diagnosis not present

## 2022-12-08 DIAGNOSIS — E7800 Pure hypercholesterolemia, unspecified: Secondary | ICD-10-CM | POA: Diagnosis not present

## 2022-12-08 DIAGNOSIS — F419 Anxiety disorder, unspecified: Secondary | ICD-10-CM | POA: Diagnosis not present

## 2022-12-08 DIAGNOSIS — J449 Chronic obstructive pulmonary disease, unspecified: Secondary | ICD-10-CM | POA: Diagnosis not present

## 2022-12-08 DIAGNOSIS — F32A Depression, unspecified: Secondary | ICD-10-CM | POA: Diagnosis not present

## 2022-12-08 LAB — CBC WITH DIFFERENTIAL (CANCER CENTER ONLY)
Abs Immature Granulocytes: 0.02 10*3/uL (ref 0.00–0.07)
Basophils Absolute: 0.1 10*3/uL (ref 0.0–0.1)
Basophils Relative: 2 %
Eosinophils Absolute: 0.2 10*3/uL (ref 0.0–0.5)
Eosinophils Relative: 3 %
HCT: 36 % — ABNORMAL LOW (ref 39.0–52.0)
Hemoglobin: 12.2 g/dL — ABNORMAL LOW (ref 13.0–17.0)
Immature Granulocytes: 0 %
Lymphocytes Relative: 23 %
Lymphs Abs: 1.3 10*3/uL (ref 0.7–4.0)
MCH: 35.8 pg — ABNORMAL HIGH (ref 26.0–34.0)
MCHC: 33.9 g/dL (ref 30.0–36.0)
MCV: 105.6 fL — ABNORMAL HIGH (ref 80.0–100.0)
Monocytes Absolute: 0.4 10*3/uL (ref 0.1–1.0)
Monocytes Relative: 8 %
Neutro Abs: 3.5 10*3/uL (ref 1.7–7.7)
Neutrophils Relative %: 64 %
Platelet Count: 442 10*3/uL — ABNORMAL HIGH (ref 150–400)
RBC: 3.41 MIL/uL — ABNORMAL LOW (ref 4.22–5.81)
RDW: 17.4 % — ABNORMAL HIGH (ref 11.5–15.5)
WBC Count: 5.5 10*3/uL (ref 4.0–10.5)
nRBC: 0 % (ref 0.0–0.2)

## 2022-12-08 MED ORDER — HYDROXYUREA 500 MG PO CAPS
ORAL_CAPSULE | ORAL | 1 refills | Status: DC
Start: 2022-12-08 — End: 2023-02-09

## 2022-12-08 NOTE — Progress Notes (Signed)
  [facility] OFFICE PROGRESS NOTE   Diagnosis: Essential thrombocytosis  INTERVAL HISTORY:   Shane Adams returns as scheduled.  He feels well.  He continues hydroxyurea.  He takes hydroxyurea at a dose of 1000 mg daily except for 500 mg on Monday and Thursday.  No bleeding or symptom of thrombosis.  He bruises easily.  No nausea or mucositis.  Objective:  Vital signs in last 24 hours:  Blood pressure (!) 103/52, pulse (!) 57, temperature 98.1 F (36.7 C), temperature source Oral, resp. rate 16, weight 152 lb 4.8 oz (69.1 kg), SpO2 100 %.    HEENT: No thrush or ulcers Resp: Distant breath sounds with bilateral expiratory wheeze, no respiratory distress Cardio: Regular rate and rhythm GI: No hepatosplenomegaly Vascular: No leg edema    Lab Results:  Lab Results  Component Value Date   WBC 5.5 12/08/2022   HGB 12.2 (L) 12/08/2022   HCT 36.0 (L) 12/08/2022   MCV 105.6 (H) 12/08/2022   PLT 442 (H) 12/08/2022   NEUTROABS 3.5 12/08/2022    CMP  Lab Results  Component Value Date   NA 139 08/11/2022   K 4.8 08/11/2022   CL 102 08/11/2022   CO2 29 08/11/2022   GLUCOSE 103 (H) 08/11/2022   BUN 25 (H) 08/11/2022   CREATININE 1.33 08/11/2022   CALCIUM 8.9 08/11/2022   PROT 6.5 08/11/2022   ALBUMIN 3.8 08/11/2022   AST 14 08/11/2022   ALT 10 08/11/2022   ALKPHOS 69 08/11/2022   BILITOT 0.5 08/11/2022   GFRNONAA 57 (L) 01/06/2022   GFRAA 90 09/24/2007    No results found for: "CEA1", "CEA", "ZOX096", "CA125"  No results found for: "INR", "LABPROT"  Imaging:  No results found.  Medications: I have reviewed the patient's current medications.   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 Ultrasound abdomen 03/17/2021-splenomegaly Hydroxyurea 500 mg daily on Monday-Friday beginning 03/28/2021 Hydroxyurea 500 mg daily beginning 04/15/2021 Hydroxyurea 1000 mg Monday, Wednesday, and Friday, 500 mg other days  09/09/2021 Hydroxyurea 1000 mg Monday-Friday, 500 mg Saturday/Sunday, 10/21/2021 Hydroxyurea placed on hold 12/02/2021 Hydroxyurea resumed-500 mg daily 07/24/2022 Hydroxyurea increased to 1000 mg Monday and Thursday, 500 mg other days 09/01/2022 Hydroxyurea increased to 1000 mg daily except for 500 mg on Monday and Thursday 10/13/2022 Weight loss COPD Tobacco use Hypertension Diabetes Hypercholesterolemia Reflux Depression/anxiety   Disposition: Shane Adams appears stable.  The platelet count has decreased.  He is tolerating hydroxyurea well.  He will continue hydroxyurea at the current dose.  He will return for a CBC in 1 month and an office visit in 2 months.  Thornton Papas, MD  12/08/2022  8:22 AM

## 2023-01-05 ENCOUNTER — Telehealth: Payer: Self-pay

## 2023-01-05 ENCOUNTER — Inpatient Hospital Stay: Payer: Medicare PPO | Attending: Oncology

## 2023-01-05 DIAGNOSIS — D473 Essential (hemorrhagic) thrombocythemia: Secondary | ICD-10-CM | POA: Insufficient documentation

## 2023-01-05 DIAGNOSIS — F32A Depression, unspecified: Secondary | ICD-10-CM | POA: Diagnosis not present

## 2023-01-05 DIAGNOSIS — F419 Anxiety disorder, unspecified: Secondary | ICD-10-CM | POA: Diagnosis not present

## 2023-01-05 DIAGNOSIS — I10 Essential (primary) hypertension: Secondary | ICD-10-CM | POA: Diagnosis not present

## 2023-01-05 DIAGNOSIS — J449 Chronic obstructive pulmonary disease, unspecified: Secondary | ICD-10-CM | POA: Insufficient documentation

## 2023-01-05 DIAGNOSIS — D75839 Thrombocytosis, unspecified: Secondary | ICD-10-CM

## 2023-01-05 DIAGNOSIS — E1159 Type 2 diabetes mellitus with other circulatory complications: Secondary | ICD-10-CM | POA: Insufficient documentation

## 2023-01-05 LAB — CMP (CANCER CENTER ONLY)
ALT: 7 U/L (ref 0–44)
AST: 10 U/L — ABNORMAL LOW (ref 15–41)
Albumin: 3.7 g/dL (ref 3.5–5.0)
Alkaline Phosphatase: 57 U/L (ref 38–126)
Anion gap: 7 (ref 5–15)
BUN: 20 mg/dL (ref 8–23)
CO2: 28 mmol/L (ref 22–32)
Calcium: 8.8 mg/dL — ABNORMAL LOW (ref 8.9–10.3)
Chloride: 102 mmol/L (ref 98–111)
Creatinine: 1.27 mg/dL — ABNORMAL HIGH (ref 0.61–1.24)
GFR, Estimated: >60 mL/min (ref >60)
Glucose, Bld: 100 mg/dL — ABNORMAL HIGH (ref 70–99)
Potassium: 4.4 mmol/L (ref 3.5–5.1)
Sodium: 137 mmol/L (ref 135–145)
Total Bilirubin: 1 mg/dL (ref 0.3–1.2)
Total Protein: 5.9 g/dL — ABNORMAL LOW (ref 6.5–8.1)

## 2023-01-05 LAB — CBC WITH DIFFERENTIAL (CANCER CENTER ONLY)
Abs Immature Granulocytes: 0.01 10*3/uL (ref 0.00–0.07)
Basophils Absolute: 0.1 10*3/uL (ref 0.0–0.1)
Basophils Relative: 2 %
Eosinophils Absolute: 0.2 10*3/uL (ref 0.0–0.5)
Eosinophils Relative: 3 %
HCT: 35.3 % — ABNORMAL LOW (ref 39.0–52.0)
Hemoglobin: 11.9 g/dL — ABNORMAL LOW (ref 13.0–17.0)
Immature Granulocytes: 0 %
Lymphocytes Relative: 24 %
Lymphs Abs: 1.2 10*3/uL (ref 0.7–4.0)
MCH: 38.4 pg — ABNORMAL HIGH (ref 26.0–34.0)
MCHC: 33.7 g/dL (ref 30.0–36.0)
MCV: 113.9 fL — ABNORMAL HIGH (ref 80.0–100.0)
Monocytes Absolute: 0.4 10*3/uL (ref 0.1–1.0)
Monocytes Relative: 7 %
Neutro Abs: 3.3 10*3/uL (ref 1.7–7.7)
Neutrophils Relative %: 64 %
Platelet Count: 389 10*3/uL (ref 150–400)
RBC: 3.1 MIL/uL — ABNORMAL LOW (ref 4.22–5.81)
RDW: 17 % — ABNORMAL HIGH (ref 11.5–15.5)
WBC Count: 5.1 10*3/uL (ref 4.0–10.5)
nRBC: 0 % (ref 0.0–0.2)

## 2023-01-05 NOTE — Telephone Encounter (Signed)
Patient gave verbal understanding and had no further questions or concerns  

## 2023-01-05 NOTE — Telephone Encounter (Signed)
-----   Message from Ladene Artist, MD sent at 01/05/2023  3:48 PM EDT ----- Please call patient, the platelet count is in goal range, continue hydroxyurea at the current dose, follow-up as scheduled

## 2023-01-16 ENCOUNTER — Telehealth: Payer: Self-pay | Admitting: Nurse Practitioner

## 2023-02-02 ENCOUNTER — Inpatient Hospital Stay: Payer: Medicare PPO

## 2023-02-02 ENCOUNTER — Ambulatory Visit: Payer: Medicare PPO | Admitting: Nurse Practitioner

## 2023-02-09 ENCOUNTER — Encounter: Payer: Self-pay | Admitting: Nurse Practitioner

## 2023-02-09 ENCOUNTER — Inpatient Hospital Stay: Payer: Medicare PPO | Admitting: Nurse Practitioner

## 2023-02-09 ENCOUNTER — Inpatient Hospital Stay: Payer: Medicare PPO | Attending: Oncology

## 2023-02-09 VITALS — BP 98/60 | HR 89 | Temp 98.1°F | Resp 18 | Ht 72.0 in | Wt 153.5 lb

## 2023-02-09 DIAGNOSIS — F419 Anxiety disorder, unspecified: Secondary | ICD-10-CM | POA: Diagnosis not present

## 2023-02-09 DIAGNOSIS — E7800 Pure hypercholesterolemia, unspecified: Secondary | ICD-10-CM | POA: Insufficient documentation

## 2023-02-09 DIAGNOSIS — D473 Essential (hemorrhagic) thrombocythemia: Secondary | ICD-10-CM | POA: Insufficient documentation

## 2023-02-09 DIAGNOSIS — I10 Essential (primary) hypertension: Secondary | ICD-10-CM | POA: Diagnosis not present

## 2023-02-09 DIAGNOSIS — D75839 Thrombocytosis, unspecified: Secondary | ICD-10-CM

## 2023-02-09 DIAGNOSIS — F32A Depression, unspecified: Secondary | ICD-10-CM | POA: Diagnosis not present

## 2023-02-09 DIAGNOSIS — E1159 Type 2 diabetes mellitus with other circulatory complications: Secondary | ICD-10-CM | POA: Diagnosis not present

## 2023-02-09 DIAGNOSIS — Z720 Tobacco use: Secondary | ICD-10-CM | POA: Diagnosis not present

## 2023-02-09 DIAGNOSIS — J449 Chronic obstructive pulmonary disease, unspecified: Secondary | ICD-10-CM | POA: Diagnosis not present

## 2023-02-09 DIAGNOSIS — K219 Gastro-esophageal reflux disease without esophagitis: Secondary | ICD-10-CM | POA: Insufficient documentation

## 2023-02-09 LAB — CBC WITH DIFFERENTIAL (CANCER CENTER ONLY)
Abs Immature Granulocytes: 0.02 10*3/uL (ref 0.00–0.07)
Basophils Absolute: 0.1 10*3/uL (ref 0.0–0.1)
Basophils Relative: 1 %
Eosinophils Absolute: 0.1 10*3/uL (ref 0.0–0.5)
Eosinophils Relative: 2 %
HCT: 34.1 % — ABNORMAL LOW (ref 39.0–52.0)
Hemoglobin: 11.5 g/dL — ABNORMAL LOW (ref 13.0–17.0)
Immature Granulocytes: 1 %
Lymphocytes Relative: 27 %
Lymphs Abs: 1.1 10*3/uL (ref 0.7–4.0)
MCH: 40.5 pg — ABNORMAL HIGH (ref 26.0–34.0)
MCHC: 33.7 g/dL (ref 30.0–36.0)
MCV: 120.1 fL — ABNORMAL HIGH (ref 80.0–100.0)
Monocytes Absolute: 0.3 10*3/uL (ref 0.1–1.0)
Monocytes Relative: 8 %
Neutro Abs: 2.4 10*3/uL (ref 1.7–7.7)
Neutrophils Relative %: 61 %
Platelet Count: 390 10*3/uL (ref 150–400)
RBC: 2.84 MIL/uL — ABNORMAL LOW (ref 4.22–5.81)
RDW: 14.5 % (ref 11.5–15.5)
WBC Count: 3.9 10*3/uL — ABNORMAL LOW (ref 4.0–10.5)
nRBC: 0 % (ref 0.0–0.2)

## 2023-02-09 MED ORDER — HYDROXYUREA 500 MG PO CAPS
ORAL_CAPSULE | ORAL | 1 refills | Status: DC
Start: 2023-02-09 — End: 2023-05-14

## 2023-02-09 NOTE — Progress Notes (Signed)
  [facility] OFFICE PROGRESS NOTE   Diagnosis: Essential thrombocytosis  INTERVAL HISTORY:   Shane Adams returns as scheduled.  He continues hydroxyurea, current dose 1000 mg daily except 500 mg on Monday and Thursday.  He denies nausea/vomiting.  No mouth sores.  No diarrhea.  No rash.  No bleeding.  No symptom of thrombosis.  Objective:  Vital signs in last 24 hours:  Blood pressure 98/60, pulse 89, temperature 98.1 F (36.7 C), resp. rate 18, height 6' (1.829 m), weight 153 lb 8 oz (69.6 kg), SpO2 96%.    HEENT: No thrush or ulcers. Resp: Distant breath sounds, a few expiratory wheezes.  No respiratory distress. Cardio: Regular rate and rhythm. GI: No hepatosplenomegaly. Vascular: No leg edema. Skin: No rash.   Lab Results:  Lab Results  Component Value Date   WBC 3.9 (L) 02/09/2023   HGB 11.5 (L) 02/09/2023   HCT 34.1 (L) 02/09/2023   MCV 120.1 (H) 02/09/2023   PLT 390 02/09/2023   NEUTROABS 2.4 02/09/2023    Imaging:  No results found.  Medications: I have reviewed the patient's current medications.  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 Ultrasound abdomen 03/17/2021-splenomegaly Hydroxyurea 500 mg daily on Monday-Friday beginning 03/28/2021 Hydroxyurea 500 mg daily beginning 04/15/2021 Hydroxyurea 1000 mg Monday, Wednesday, and Friday, 500 mg other days 09/09/2021 Hydroxyurea 1000 mg Monday-Friday, 500 mg Saturday/Sunday, 10/21/2021 Hydroxyurea placed on hold 12/02/2021 Hydroxyurea resumed-500 mg daily 07/24/2022 Hydroxyurea increased to 1000 mg Monday and Thursday, 500 mg other days 09/01/2022 Hydroxyurea increased to 1000 mg daily except for 500 mg on Monday and Thursday 10/13/2022 Weight loss COPD Tobacco use Hypertension Diabetes Hypercholesterolemia Reflux Depression/anxiety  Disposition: Shane Adams appears stable.  The platelet count remains in goal range.  Mild progression of anemia, asymptomatic.   He is tolerating hydroxyurea well.  Plan to continue at the current dose.  Signs/symptoms suggestive of progressive anemia reviewed.  He understands to contact the office should he develop any of these.  Repeat CBC in 1 month.  Lab and follow-up in 2 months.   Lonna Cobb ANP/GNP-BC   02/09/2023  8:55 AM

## 2023-02-15 ENCOUNTER — Other Ambulatory Visit: Payer: Self-pay

## 2023-02-15 ENCOUNTER — Encounter: Payer: Self-pay | Admitting: Internal Medicine

## 2023-02-15 NOTE — Patient Instructions (Addendum)
Have an eye appointment.     Blood work was ordered.   The lab is on the first floor.    Medications changes include :   stop lisinopril.  Start omeprazole 20 mg daily - take 30 minutes prior to a meal.  Use the advair twice a day    A referral was ordered for Dermatology and someone will call you to schedule an appointment.     Return in about 6 months (around 08/19/2023) for follow up.   Health Maintenance, Male Adopting a healthy lifestyle and getting preventive care are important in promoting health and wellness. Ask your health care provider about: The right schedule for you to have regular tests and exams. Things you can do on your own to prevent diseases and keep yourself healthy. What should I know about diet, weight, and exercise? Eat a healthy diet  Eat a diet that includes plenty of vegetables, fruits, low-fat dairy products, and lean protein. Do not eat a lot of foods that are high in solid fats, added sugars, or sodium. Maintain a healthy weight Body mass index (BMI) is a measurement that can be used to identify possible weight problems. It estimates body fat based on height and weight. Your health care provider can help determine your BMI and help you achieve or maintain a healthy weight. Get regular exercise Get regular exercise. This is one of the most important things you can do for your health. Most adults should: Exercise for at least 150 minutes each week. The exercise should increase your heart rate and make you sweat (moderate-intensity exercise). Do strengthening exercises at least twice a week. This is in addition to the moderate-intensity exercise. Spend less time sitting. Even light physical activity can be beneficial. Watch cholesterol and blood lipids Have your blood tested for lipids and cholesterol at 69 years of age, then have this test every 5 years. You may need to have your cholesterol levels checked more often if: Your lipid or cholesterol  levels are high. You are older than 69 years of age. You are at high risk for heart disease. What should I know about cancer screening? Many types of cancers can be detected early and may often be prevented. Depending on your health history and family history, you may need to have cancer screening at various ages. This may include screening for: Colorectal cancer. Prostate cancer. Skin cancer. Lung cancer. What should I know about heart disease, diabetes, and high blood pressure? Blood pressure and heart disease High blood pressure causes heart disease and increases the risk of stroke. This is more likely to develop in people who have high blood pressure readings or are overweight. Talk with your health care provider about your target blood pressure readings. Have your blood pressure checked: Every 3-5 years if you are 55-10 years of age. Every year if you are 20 years old or older. If you are between the ages of 29 and 66 and are a current or former smoker, ask your health care provider if you should have a one-time screening for abdominal aortic aneurysm (AAA). Diabetes Have regular diabetes screenings. This checks your fasting blood sugar level. Have the screening done: Once every three years after age 65 if you are at a normal weight and have a low risk for diabetes. More often and at a younger age if you are overweight or have a high risk for diabetes. What should I know about preventing infection? Hepatitis B If you have a higher  risk for hepatitis B, you should be screened for this virus. Talk with your health care provider to find out if you are at risk for hepatitis B infection. Hepatitis C Blood testing is recommended for: Everyone born from [birth year] through 1967. Anyone with known risk factors for hepatitis C. Sexually transmitted infections (STIs) You should be screened each year for STIs, including gonorrhea and chlamydia, if: You are sexually active and are younger than 69  years of age. You are older than 69 years of age and your health care provider tells you that you are at risk for this type of infection. Your sexual activity has changed since you were last screened, and you are at increased risk for chlamydia or gonorrhea. Ask your health care provider if you are at risk. Ask your health care provider about whether you are at high risk for HIV. Your health care provider may recommend a prescription medicine to help prevent HIV infection. If you choose to take medicine to prevent HIV, you should first get tested for HIV. You should then be tested every 3 months for as long as you are taking the medicine. Follow these instructions at home: Alcohol use Do not drink alcohol if your health care provider tells you not to drink. If you drink alcohol: Limit how much you have to 0-2 drinks a day. Know how much alcohol is in your drink. In the [place] one drink equals one 12 oz bottle of beer (355 mL), one 5 oz glass of wine (148 mL), or one 1 oz glass of hard liquor (44 mL). Lifestyle Do not use any products that contain nicotine or tobacco. These products include cigarettes, chewing tobacco, and vaping devices, such as e-cigarettes. If you need help quitting, ask your health care provider. Do not use street drugs. Do not share needles. Ask your health care provider for help if you need support or information about quitting drugs. General instructions Schedule regular health, dental, and eye exams. Stay current with your vaccines. Tell your health care provider if: You often feel depressed. You have ever been abused or do not feel safe at home. Summary Adopting a healthy lifestyle and getting preventive care are important in promoting health and wellness. Follow your health care provider's instructions about healthy diet, exercising, and getting tested or screened for diseases. Follow your health care provider's instructions on monitoring your cholesterol and blood  pressure. This information is not intended to replace advice given to you by your health care provider. Make sure you discuss any questions you have with your health care provider. Document Revised: 11/15/2020 Document Reviewed: 11/15/2020 Elsevier Patient Education  2024 [employer].

## 2023-02-15 NOTE — Progress Notes (Signed)
Subjective:    Patient ID: Shane Adams, male    DOB: 06-23-1956, 67 y.o.   MRN: [id number]     HPI Shane Adams is here for a physical exam and his chronic medical problems.  Here with his wife.  She is concerned about his memory and energy level.  Some days he does not do much needed sleep a little during the day.  She does the hydroxyurea is likely causing some of the fatigue and decreased motivation.  Still smoking-does not have a desire to quit.    Medications and allergies reviewed with patient and updated if appropriate.  Current Outpatient Medications on File Prior to Visit  Medication Sig Dispense Refill   aspirin EC 81 MG tablet Take 1 tablet (81 mg total) by mouth daily.     atorvastatin (LIPITOR) 20 MG tablet Take 1 tablet (20 mg total) by mouth daily. 90 tablet 2   FLUoxetine (PROZAC) 40 MG capsule Take 1 capsule (40 mg total) by mouth daily. 90 capsule 3   fluticasone-salmeterol (ADVAIR) 100-50 MCG/ACT AEPB INHALE 1 PUFF INTO LUNGS EVERY 12 HOURS 60 each 0   hydroxyurea (HYDREA) 500 MG capsule Take #2 (1000 mg) capsules daily, except #1 (500 mg) capsule on Mondays and Thursdays. May take with food to minimize GI side effects. 60 capsule 1   lisinopril (ZESTRIL) 5 MG tablet Take 1 tablet (5 mg total) by mouth daily. 90 tablet 3   metFORMIN (GLUCOPHAGE) 500 MG tablet 1/2 tablet daily with largest meal 45 tablet 3   Multiple Vitamin (MULTIVITAMIN) tablet Take 1 tablet by mouth daily.     No current facility-administered medications on file prior to visit.    Review of Systems  Constitutional:  Negative for fever.  Eyes:  Negative for visual disturbance.  Respiratory:  Positive for shortness of breath and wheezing. Negative for cough.   Cardiovascular:  Negative for chest pain, palpitations and leg swelling.  Gastrointestinal:  Negative for abdominal pain, blood in stool, constipation and diarrhea.       Gerd daily  Genitourinary:  Negative for difficulty urinating,  dysuria and hematuria.  Musculoskeletal:  Negative for arthralgias and back pain.  Skin:  Negative for rash.       Dry spot on leg - ? larger  Neurological:  Negative for light-headedness and headaches.  Psychiatric/Behavioral:  Positive for dysphoric mood (? maybe). The patient is nervous/anxious.        Objective:   Vitals:   02/16/23 0802  BP: 102/68  Pulse: 70  Temp: 98 F (36.7 C)  SpO2: 98%   Filed Weights   02/16/23 0802  Weight: 153 lb 6.4 oz (69.6 kg)   Body mass index is 20.8 kg/m.  BP Readings from Last 3 Encounters:  02/16/23 102/68  02/09/23 98/60  12/08/22 (!) 103/52    Wt Readings from Last 3 Encounters:  02/16/23 153 lb 6.4 oz (69.6 kg)  02/09/23 153 lb 8 oz (69.6 kg)  12/08/22 152 lb 4.8 oz (69.1 kg)      Physical Exam Constitutional: He appears well-developed and well-nourished. No distress.  HENT:  Head: Normocephalic and atraumatic.  Right Ear: External ear normal.  Left Ear: External ear normal.  Normal ear canals and TM b/l  Mouth/Throat: Oropharynx is clear and moist. Eyes: Conjunctivae and EOM are normal.  Neck: Neck supple. No tracheal deviation present. No thyromegaly present.  No carotid bruit  Cardiovascular: Normal rate, regular rhythm, normal heart sounds and intact distal pulses.  No murmur heard.  No lower extremity edema. Pulmonary/Chest: Effort normal and breath sounds normal. No respiratory distress.  Diffuse expiratory wheezing. He has no rales.  Abdominal: Soft. He exhibits no distension. There is no tenderness.  Genitourinary: deferred  Lymphadenopathy:   He has no cervical adenopathy.  Skin: Skin is warm and dry. He is not diaphoretic.  Psychiatric: He has a normal mood and affect. His behavior is normal.         Assessment & Plan:   Physical exam: Screening blood work  ordered Exercise   mows grass once a week - encouraged increasing exercise and moving more Weight normal Substance abuse   smoking 1/2 ppd --  advised to quit - does not have any desire to quit   Reviewed recommended immunizations.   Health Maintenance  Topic Date Due   Medicare Annual Wellness (AWV)  Never done   Zoster Vaccines- Shingrix (1 of 2) 03/13/2004   FOOT EXAM  02/04/2020   COVID-19 Vaccine (5 - 2023-24 season) 03/10/2022   OPHTHALMOLOGY EXAM  06/07/2022   HEMOGLOBIN A1C  02/09/2023   Colonoscopy  04/18/2023   INFLUENZA VACCINE  10/08/2023 (Originally 02/08/2023)   Diabetic kidney evaluation - Urine ACR  08/12/2023   Diabetic kidney evaluation - eGFR measurement  01/05/2024   DTaP/Tdap/Td (3 - Td or Tdap) 01/09/2024   Pneumonia Vaccine 46+ Years old  Completed   Hepatitis C Screening  Completed   HPV VACCINES  Aged Out     See Problem List for Assessment and Plan of chronic medical problems.

## 2023-02-16 ENCOUNTER — Ambulatory Visit (INDEPENDENT_AMBULATORY_CARE_PROVIDER_SITE_OTHER): Payer: Medicare PPO | Admitting: Internal Medicine

## 2023-02-16 VITALS — BP 102/68 | HR 70 | Temp 98.0°F | Ht 72.0 in | Wt 153.4 lb

## 2023-02-16 DIAGNOSIS — F419 Anxiety disorder, unspecified: Secondary | ICD-10-CM

## 2023-02-16 DIAGNOSIS — F32A Depression, unspecified: Secondary | ICD-10-CM | POA: Diagnosis not present

## 2023-02-16 DIAGNOSIS — I10 Essential (primary) hypertension: Secondary | ICD-10-CM | POA: Diagnosis not present

## 2023-02-16 DIAGNOSIS — E1169 Type 2 diabetes mellitus with other specified complication: Secondary | ICD-10-CM | POA: Diagnosis not present

## 2023-02-16 DIAGNOSIS — Z7984 Long term (current) use of oral hypoglycemic drugs: Secondary | ICD-10-CM

## 2023-02-16 DIAGNOSIS — J449 Chronic obstructive pulmonary disease, unspecified: Secondary | ICD-10-CM

## 2023-02-16 DIAGNOSIS — K219 Gastro-esophageal reflux disease without esophagitis: Secondary | ICD-10-CM | POA: Diagnosis not present

## 2023-02-16 DIAGNOSIS — Z0000 Encounter for general adult medical examination without abnormal findings: Secondary | ICD-10-CM | POA: Diagnosis not present

## 2023-02-16 DIAGNOSIS — Z125 Encounter for screening for malignant neoplasm of prostate: Secondary | ICD-10-CM | POA: Diagnosis not present

## 2023-02-16 DIAGNOSIS — L989 Disorder of the skin and subcutaneous tissue, unspecified: Secondary | ICD-10-CM

## 2023-02-16 DIAGNOSIS — I2510 Atherosclerotic heart disease of native coronary artery without angina pectoris: Secondary | ICD-10-CM

## 2023-02-16 DIAGNOSIS — E782 Mixed hyperlipidemia: Secondary | ICD-10-CM

## 2023-02-16 LAB — COMPREHENSIVE METABOLIC PANEL
ALT: 11 U/L (ref 0–53)
AST: 11 U/L (ref 0–37)
Albumin: 3.7 g/dL (ref 3.5–5.2)
Alkaline Phosphatase: 64 U/L (ref 39–117)
BUN: 22 mg/dL (ref 6–23)
CO2: 30 mEq/L (ref 19–32)
Calcium: 8.8 mg/dL (ref 8.4–10.5)
Chloride: 103 mEq/L (ref 96–112)
Creatinine, Ser: 1.31 mg/dL (ref 0.40–1.50)
GFR: 55.77 mL/min — ABNORMAL LOW (ref >60.00)
Glucose, Bld: 100 mg/dL — ABNORMAL HIGH (ref 70–99)
Potassium: 4.5 mEq/L (ref 3.5–5.1)
Sodium: 140 mEq/L (ref 135–145)
Total Bilirubin: 0.7 mg/dL (ref 0.2–1.2)
Total Protein: 5.8 g/dL — ABNORMAL LOW (ref 6.0–8.3)

## 2023-02-16 LAB — LIPID PANEL
Cholesterol: 97 mg/dL (ref 0–200)
HDL: 45.4 mg/dL (ref >39.00)
LDL Cholesterol: 40 mg/dL (ref 0–99)
NonHDL: 51.22
Total CHOL/HDL Ratio: 2
Triglycerides: 57 mg/dL (ref 0.0–149.0)
VLDL: 11.4 mg/dL (ref 0.0–40.0)

## 2023-02-16 LAB — PSA, MEDICARE: PSA: 3.04 ng/ml (ref 0.10–4.00)

## 2023-02-16 LAB — HEMOGLOBIN A1C: Hgb A1c MFr Bld: 5.3 % (ref 4.6–6.5)

## 2023-02-16 LAB — TSH: TSH: 2.37 u[IU]/mL (ref 0.35–5.50)

## 2023-02-16 MED ORDER — FLUOXETINE HCL 40 MG PO CAPS: 40.0000 mg | ORAL_CAPSULE | Freq: Every day | ORAL | 3 refills | Status: DC

## 2023-02-16 MED ORDER — FLUTICASONE-SALMETEROL 100-50 MCG/ACT IN AEPB: 1.0000 | INHALATION_SPRAY | Freq: Two times a day (BID) | RESPIRATORY_TRACT | 11 refills | Status: DC

## 2023-02-16 MED ORDER — METFORMIN HCL 500 MG PO TABS: ORAL_TABLET | ORAL | 3 refills | Status: DC

## 2023-02-16 MED ORDER — ATORVASTATIN CALCIUM 20 MG PO TABS: 20.0000 mg | ORAL_TABLET | Freq: Every day | ORAL | 3 refills | Status: DC

## 2023-02-16 MED ORDER — OMEPRAZOLE 20 MG PO CPDR: 20.0000 mg | DELAYED_RELEASE_CAPSULE | Freq: Every day | ORAL | 3 refills | Status: DC

## 2023-02-16 NOTE — Assessment & Plan Note (Signed)
Chronic Regular exercise and healthy diet encouraged Check lipid panel, CMP Continue atorvastatin 20 mg daily 

## 2023-02-16 NOTE — Assessment & Plan Note (Signed)
New Has GERD on a daily basis Currently taking Pepcid 20 mg daily Start omeprazole 20 mg daily Can take Pepcid 20 mg daily as needed

## 2023-02-16 NOTE — Assessment & Plan Note (Signed)
Chronic Diffuse wheezing on exam He is still actively smoking-encouraged smoking cessation, but he has no interest in quitting Currently taking Advair as needed Start taking Advair twice daily every day

## 2023-02-16 NOTE — Assessment & Plan Note (Signed)
Chronic   Lab Results  Component Value Date   HGBA1C 5.9 08/11/2022   Sugars well controlled Check A1c, urine microalbumin today Continue metformin 250 mg daily Stressed regular exercise, diabetic diet

## 2023-02-16 NOTE — Assessment & Plan Note (Addendum)
Chronic His wife feels that he is a little depressed and anxious.  He knows he is anxious-not sure if he is depressed or not.  Does not feel like any adjustment in his medication is necessary Continue fluoxetine 40 mg daily

## 2023-02-16 NOTE — Assessment & Plan Note (Addendum)
Chronic Blood pressure well controlled and likely overcontrolled CMP Discontinue lisinopril 5 mg daily Monitor off medication

## 2023-02-16 NOTE — Assessment & Plan Note (Addendum)
Chronic Coronary atherosclerosis seen on imaging No symptoms consistent with angina Continue aspirin 81 mg daily, atorvastatin 20 mg daily Blood pressure well controlled Sugars controlled Regular exercise, healthy diet Encouraged smoking cessation

## 2023-03-09 ENCOUNTER — Inpatient Hospital Stay: Payer: Medicare PPO

## 2023-03-09 DIAGNOSIS — F419 Anxiety disorder, unspecified: Secondary | ICD-10-CM | POA: Diagnosis not present

## 2023-03-09 DIAGNOSIS — D473 Essential (hemorrhagic) thrombocythemia: Secondary | ICD-10-CM | POA: Diagnosis not present

## 2023-03-09 DIAGNOSIS — D75839 Thrombocytosis, unspecified: Secondary | ICD-10-CM

## 2023-03-09 DIAGNOSIS — F32A Depression, unspecified: Secondary | ICD-10-CM | POA: Diagnosis not present

## 2023-03-09 DIAGNOSIS — E7800 Pure hypercholesterolemia, unspecified: Secondary | ICD-10-CM | POA: Diagnosis not present

## 2023-03-09 DIAGNOSIS — Z720 Tobacco use: Secondary | ICD-10-CM | POA: Diagnosis not present

## 2023-03-09 DIAGNOSIS — I10 Essential (primary) hypertension: Secondary | ICD-10-CM | POA: Diagnosis not present

## 2023-03-09 DIAGNOSIS — K219 Gastro-esophageal reflux disease without esophagitis: Secondary | ICD-10-CM | POA: Diagnosis not present

## 2023-03-09 DIAGNOSIS — E1159 Type 2 diabetes mellitus with other circulatory complications: Secondary | ICD-10-CM | POA: Diagnosis not present

## 2023-03-09 DIAGNOSIS — J449 Chronic obstructive pulmonary disease, unspecified: Secondary | ICD-10-CM | POA: Diagnosis not present

## 2023-03-09 LAB — CBC WITH DIFFERENTIAL (CANCER CENTER ONLY)
Abs Immature Granulocytes: 0.03 10*3/uL (ref 0.00–0.07)
Basophils Absolute: 0.1 10*3/uL (ref 0.0–0.1)
Basophils Relative: 1 %
Eosinophils Absolute: 0.1 10*3/uL (ref 0.0–0.5)
Eosinophils Relative: 2 %
HCT: 35.3 % — ABNORMAL LOW (ref 39.0–52.0)
Hemoglobin: 12.2 g/dL — ABNORMAL LOW (ref 13.0–17.0)
Immature Granulocytes: 1 %
Lymphocytes Relative: 28 %
Lymphs Abs: 1.1 10*3/uL (ref 0.7–4.0)
MCH: 42.2 pg — ABNORMAL HIGH (ref 26.0–34.0)
MCHC: 34.6 g/dL (ref 30.0–36.0)
MCV: 122.1 fL — ABNORMAL HIGH (ref 80.0–100.0)
Monocytes Absolute: 0.3 10*3/uL (ref 0.1–1.0)
Monocytes Relative: 9 %
Neutro Abs: 2.3 10*3/uL (ref 1.7–7.7)
Neutrophils Relative %: 59 %
Platelet Count: 324 10*3/uL (ref 150–400)
RBC: 2.89 MIL/uL — ABNORMAL LOW (ref 4.22–5.81)
RDW: 13.6 % (ref 11.5–15.5)
WBC Count: 3.9 10*3/uL — ABNORMAL LOW (ref 4.0–10.5)
nRBC: 0 % (ref 0.0–0.2)

## 2023-03-15 ENCOUNTER — Telehealth: Payer: Self-pay

## 2023-03-15 NOTE — Telephone Encounter (Signed)
Patient gave verbal understanding and had no further questions or concerns  

## 2023-03-15 NOTE — Telephone Encounter (Signed)
-----   Message from Lonna Cobb sent at 03/15/2023  8:19 AM EDT ----- Please let him know blood counts look good.  Continue same Hydrea.  Follow-up as scheduled.

## 2023-03-31 ENCOUNTER — Encounter: Payer: Self-pay | Admitting: Internal Medicine

## 2023-04-06 ENCOUNTER — Inpatient Hospital Stay: Payer: Medicare PPO | Admitting: Oncology

## 2023-04-06 ENCOUNTER — Inpatient Hospital Stay: Payer: Medicare PPO | Attending: Oncology

## 2023-04-06 ENCOUNTER — Encounter: Payer: Self-pay | Admitting: Oncology

## 2023-04-06 VITALS — BP 105/66 | HR 58 | Temp 97.9°F | Resp 18 | Ht 72.0 in | Wt 163.7 lb

## 2023-04-06 DIAGNOSIS — E1159 Type 2 diabetes mellitus with other circulatory complications: Secondary | ICD-10-CM | POA: Insufficient documentation

## 2023-04-06 DIAGNOSIS — F419 Anxiety disorder, unspecified: Secondary | ICD-10-CM | POA: Insufficient documentation

## 2023-04-06 DIAGNOSIS — K219 Gastro-esophageal reflux disease without esophagitis: Secondary | ICD-10-CM | POA: Diagnosis not present

## 2023-04-06 DIAGNOSIS — F32A Depression, unspecified: Secondary | ICD-10-CM | POA: Diagnosis not present

## 2023-04-06 DIAGNOSIS — J449 Chronic obstructive pulmonary disease, unspecified: Secondary | ICD-10-CM | POA: Insufficient documentation

## 2023-04-06 DIAGNOSIS — E7800 Pure hypercholesterolemia, unspecified: Secondary | ICD-10-CM | POA: Diagnosis not present

## 2023-04-06 DIAGNOSIS — I10 Essential (primary) hypertension: Secondary | ICD-10-CM | POA: Insufficient documentation

## 2023-04-06 DIAGNOSIS — D75839 Thrombocytosis, unspecified: Secondary | ICD-10-CM

## 2023-04-06 DIAGNOSIS — D473 Essential (hemorrhagic) thrombocythemia: Secondary | ICD-10-CM | POA: Diagnosis not present

## 2023-04-06 LAB — CBC WITH DIFFERENTIAL (CANCER CENTER ONLY)
Abs Immature Granulocytes: 0.02 10*3/uL (ref 0.00–0.07)
Basophils Absolute: 0.1 10*3/uL (ref 0.0–0.1)
Basophils Relative: 1 %
Eosinophils Absolute: 0.1 10*3/uL (ref 0.0–0.5)
Eosinophils Relative: 2 %
HCT: 37 % — ABNORMAL LOW (ref 39.0–52.0)
Hemoglobin: 12.5 g/dL — ABNORMAL LOW (ref 13.0–17.0)
Immature Granulocytes: 0 %
Lymphocytes Relative: 22 %
Lymphs Abs: 1 10*3/uL (ref 0.7–4.0)
MCH: 40.8 pg — ABNORMAL HIGH (ref 26.0–34.0)
MCHC: 33.8 g/dL (ref 30.0–36.0)
MCV: 120.9 fL — ABNORMAL HIGH (ref 80.0–100.0)
Monocytes Absolute: 0.3 10*3/uL (ref 0.1–1.0)
Monocytes Relative: 7 %
Neutro Abs: 3.2 10*3/uL (ref 1.7–7.7)
Neutrophils Relative %: 68 %
Platelet Count: 321 10*3/uL (ref 150–400)
RBC: 3.06 MIL/uL — ABNORMAL LOW (ref 4.22–5.81)
RDW: 12.5 % (ref 11.5–15.5)
WBC Count: 4.7 10*3/uL (ref 4.0–10.5)
nRBC: 0 % (ref 0.0–0.2)

## 2023-04-06 LAB — CMP (CANCER CENTER ONLY)
ALT: 11 U/L (ref 0–44)
AST: 12 U/L — ABNORMAL LOW (ref 15–41)
Albumin: 3.8 g/dL (ref 3.5–5.0)
Alkaline Phosphatase: 64 U/L (ref 38–126)
Anion gap: 7 (ref 5–15)
BUN: 20 mg/dL (ref 8–23)
CO2: 26 mmol/L (ref 22–32)
Calcium: 8.9 mg/dL (ref 8.9–10.3)
Chloride: 104 mmol/L (ref 98–111)
Creatinine: 1.19 mg/dL (ref 0.61–1.24)
GFR, Estimated: >60 mL/min (ref >60)
Glucose, Bld: 118 mg/dL — ABNORMAL HIGH (ref 70–99)
Potassium: 4.2 mmol/L (ref 3.5–5.1)
Sodium: 137 mmol/L (ref 135–145)
Total Bilirubin: 0.7 mg/dL (ref 0.3–1.2)
Total Protein: 6.1 g/dL — ABNORMAL LOW (ref 6.5–8.1)

## 2023-04-06 NOTE — Progress Notes (Signed)
  [facility] OFFICE PROGRESS NOTE   Diagnosis: Thrombocytosis  INTERVAL HISTORY:   Shane Adams returns as scheduled.  He continues hydroxyurea.  No bleeding or symptom of thrombosis.  He reports a good appetite.  No complaint.  Objective:  Vital signs in last 24 hours:  Blood pressure 105/66, pulse (!) 58, temperature 97.9 F (36.6 C), temperature source Temporal, resp. rate 18, height 6' (1.829 m), weight 163 lb 11.2 oz (74.3 kg), SpO2 100%.    HEENT: No thrush or ulcers Resp: Distant breath sounds, bilateral inspiratory/expiratory wheeze, no respiratory distress Cardio: Regular rhythm with premature beats GI: No hepatosplenomegaly Vascular: No leg edema  Lab Results:  Lab Results  Component Value Date   WBC 4.7 04/06/2023   HGB 12.5 (L) 04/06/2023   HCT 37.0 (L) 04/06/2023   MCV 120.9 (H) 04/06/2023   PLT 321 04/06/2023   NEUTROABS 3.2 04/06/2023    CMP  Lab Results  Component Value Date   NA 137 04/06/2023   K 4.2 04/06/2023   CL 104 04/06/2023   CO2 26 04/06/2023   GLUCOSE 118 (H) 04/06/2023   BUN 20 04/06/2023   CREATININE 1.19 04/06/2023   CALCIUM 8.9 04/06/2023   PROT 6.1 (L) 04/06/2023   ALBUMIN 3.8 04/06/2023   AST 12 (L) 04/06/2023   ALT 11 04/06/2023   ALKPHOS 64 04/06/2023   BILITOT 0.7 04/06/2023   GFRNONAA >60 04/06/2023   GFRAA 90 09/24/2007   Medications: I have reviewed the patient's current medications.   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 Ultrasound abdomen 03/17/2021-splenomegaly Hydroxyurea 500 mg daily on Monday-Friday beginning 03/28/2021 Hydroxyurea 500 mg daily beginning 04/15/2021 Hydroxyurea 1000 mg Monday, Wednesday, and Friday, 500 mg other days 09/09/2021 Hydroxyurea 1000 mg Monday-Friday, 500 mg Saturday/Sunday, 10/21/2021 Hydroxyurea placed on hold 12/02/2021 Hydroxyurea resumed-500 mg daily 07/24/2022 Hydroxyurea increased to 1000 mg Monday and Thursday, 500 mg other  days 09/01/2022 Hydroxyurea increased to 1000 mg daily except for 500 mg on Monday and Thursday 10/13/2022 Weight loss COPD Tobacco use Hypertension Diabetes Hypercholesterolemia Reflux Depression/anxiety    Disposition: Shane Adams appears stable.  The platelet count is in goal range.  He will continue hydroxyurea at the current dose.  He will return for a lab visit in 2 months and an office visit in 4 months.  I encouraged him to obtain influenza, COVID-19, and pneumonia vaccines.  Shane Papas, MD  04/06/2023  8:47 AM

## 2023-05-02 DIAGNOSIS — Z23 Encounter for immunization: Secondary | ICD-10-CM | POA: Diagnosis not present

## 2023-05-12 ENCOUNTER — Other Ambulatory Visit: Payer: Self-pay | Admitting: Nurse Practitioner

## 2023-05-12 DIAGNOSIS — D75839 Thrombocytosis, unspecified: Secondary | ICD-10-CM

## 2023-05-28 ENCOUNTER — Ambulatory Visit: Payer: Medicare PPO | Admitting: Oncology

## 2023-05-28 ENCOUNTER — Inpatient Hospital Stay: Payer: Medicare PPO | Attending: Oncology

## 2023-05-28 DIAGNOSIS — D75839 Thrombocytosis, unspecified: Secondary | ICD-10-CM

## 2023-05-28 DIAGNOSIS — D473 Essential (hemorrhagic) thrombocythemia: Secondary | ICD-10-CM | POA: Diagnosis not present

## 2023-05-28 LAB — CBC WITH DIFFERENTIAL (CANCER CENTER ONLY)
Abs Immature Granulocytes: 0.01 10*3/uL (ref 0.00–0.07)
Basophils Absolute: 0.1 10*3/uL (ref 0.0–0.1)
Basophils Relative: 1 %
Eosinophils Absolute: 0.1 10*3/uL (ref 0.0–0.5)
Eosinophils Relative: 2 %
HCT: 35.1 % — ABNORMAL LOW (ref 39.0–52.0)
Hemoglobin: 12 g/dL — ABNORMAL LOW (ref 13.0–17.0)
Immature Granulocytes: 0 %
Lymphocytes Relative: 24 %
Lymphs Abs: 1 10*3/uL (ref 0.7–4.0)
MCH: 40.5 pg — ABNORMAL HIGH (ref 26.0–34.0)
MCHC: 34.2 g/dL (ref 30.0–36.0)
MCV: 118.6 fL — ABNORMAL HIGH (ref 80.0–100.0)
Monocytes Absolute: 0.2 10*3/uL (ref 0.1–1.0)
Monocytes Relative: 6 %
Neutro Abs: 2.7 10*3/uL (ref 1.7–7.7)
Neutrophils Relative %: 67 %
Platelet Count: 304 10*3/uL (ref 150–400)
RBC: 2.96 MIL/uL — ABNORMAL LOW (ref 4.22–5.81)
RDW: 13 % (ref 11.5–15.5)
WBC Count: 4 10*3/uL (ref 4.0–10.5)
nRBC: 0 % (ref 0.0–0.2)

## 2023-05-29 ENCOUNTER — Telehealth: Payer: Self-pay

## 2023-05-29 NOTE — Telephone Encounter (Signed)
-----   Message from Thornton Papas sent at 05/28/2023  4:54 PM EST ----- Please call patient, the platelet count remains in goal range, continue hydroxyurea at the current dose, follow-up as scheduled

## 2023-05-29 NOTE — Telephone Encounter (Signed)
Patient gave verbal understanding and had no further questions or concerns  

## 2023-06-13 ENCOUNTER — Other Ambulatory Visit: Payer: Self-pay | Admitting: Oncology

## 2023-06-13 DIAGNOSIS — D75839 Thrombocytosis, unspecified: Secondary | ICD-10-CM

## 2023-06-26 ENCOUNTER — Ambulatory Visit (HOSPITAL_BASED_OUTPATIENT_CLINIC_OR_DEPARTMENT_OTHER)
Admission: RE | Admit: 2023-06-26 | Discharge: 2023-06-26 | Disposition: A | Discharge status: Home / Self Care | Payer: Medicare PPO | Source: Ambulatory Visit | Attending: Internal Medicine | Admitting: Internal Medicine

## 2023-06-26 DIAGNOSIS — F17210 Nicotine dependence, cigarettes, uncomplicated: Secondary | ICD-10-CM | POA: Diagnosis not present

## 2023-06-26 DIAGNOSIS — Z122 Encounter for screening for malignant neoplasm of respiratory organs: Secondary | ICD-10-CM | POA: Insufficient documentation

## 2023-06-26 DIAGNOSIS — Z87891 Personal history of nicotine dependence: Secondary | ICD-10-CM

## 2023-07-08 ENCOUNTER — Encounter: Payer: Self-pay | Admitting: Oncology

## 2023-07-09 ENCOUNTER — Other Ambulatory Visit: Payer: Self-pay

## 2023-07-09 DIAGNOSIS — F17210 Nicotine dependence, cigarettes, uncomplicated: Secondary | ICD-10-CM

## 2023-07-09 DIAGNOSIS — Z122 Encounter for screening for malignant neoplasm of respiratory organs: Secondary | ICD-10-CM

## 2023-07-09 DIAGNOSIS — Z87891 Personal history of nicotine dependence: Secondary | ICD-10-CM

## 2023-07-10 ENCOUNTER — Encounter: Payer: Self-pay | Admitting: Internal Medicine

## 2023-07-10 DIAGNOSIS — I700 Atherosclerosis of aorta: Secondary | ICD-10-CM | POA: Insufficient documentation

## 2023-07-23 DIAGNOSIS — L280 Lichen simplex chronicus: Secondary | ICD-10-CM | POA: Diagnosis not present

## 2023-07-23 DIAGNOSIS — L814 Other melanin hyperpigmentation: Secondary | ICD-10-CM | POA: Diagnosis not present

## 2023-07-23 DIAGNOSIS — L821 Other seborrheic keratosis: Secondary | ICD-10-CM | POA: Diagnosis not present

## 2023-07-23 DIAGNOSIS — L570 Actinic keratosis: Secondary | ICD-10-CM | POA: Diagnosis not present

## 2023-07-23 DIAGNOSIS — D225 Melanocytic nevi of trunk: Secondary | ICD-10-CM | POA: Diagnosis not present

## 2023-07-23 DIAGNOSIS — L218 Other seborrheic dermatitis: Secondary | ICD-10-CM | POA: Diagnosis not present

## 2023-08-03 ENCOUNTER — Inpatient Hospital Stay: Payer: Medicare PPO | Attending: Oncology | Admitting: Oncology

## 2023-08-03 ENCOUNTER — Encounter: Payer: Self-pay | Admitting: Oncology

## 2023-08-03 ENCOUNTER — Inpatient Hospital Stay: Payer: Medicare PPO

## 2023-08-03 ENCOUNTER — Other Ambulatory Visit: Payer: Medicare PPO

## 2023-08-03 VITALS — BP 123/70 | HR 72 | Temp 98.2°F | Resp 18 | Ht 72.0 in | Wt 164.5 lb

## 2023-08-03 DIAGNOSIS — I10 Essential (primary) hypertension: Secondary | ICD-10-CM | POA: Diagnosis not present

## 2023-08-03 DIAGNOSIS — K219 Gastro-esophageal reflux disease without esophagitis: Secondary | ICD-10-CM | POA: Insufficient documentation

## 2023-08-03 DIAGNOSIS — Z720 Tobacco use: Secondary | ICD-10-CM | POA: Diagnosis not present

## 2023-08-03 DIAGNOSIS — E1159 Type 2 diabetes mellitus with other circulatory complications: Secondary | ICD-10-CM | POA: Insufficient documentation

## 2023-08-03 DIAGNOSIS — D473 Essential (hemorrhagic) thrombocythemia: Secondary | ICD-10-CM | POA: Diagnosis not present

## 2023-08-03 DIAGNOSIS — D75839 Thrombocytosis, unspecified: Secondary | ICD-10-CM

## 2023-08-03 DIAGNOSIS — J449 Chronic obstructive pulmonary disease, unspecified: Secondary | ICD-10-CM | POA: Diagnosis not present

## 2023-08-03 LAB — CBC WITH DIFFERENTIAL (CANCER CENTER ONLY)
Abs Immature Granulocytes: 0.01 K/uL (ref 0.00–0.07)
Basophils Absolute: 0 K/uL (ref 0.0–0.1)
Basophils Relative: 2 %
Eosinophils Absolute: 0 K/uL (ref 0.0–0.5)
Eosinophils Relative: 2 %
HCT: 32.4 % — ABNORMAL LOW (ref 39.0–52.0)
Hemoglobin: 11.1 g/dL — ABNORMAL LOW (ref 13.0–17.0)
Immature Granulocytes: 0 %
Lymphocytes Relative: 26 %
Lymphs Abs: 0.7 K/uL (ref 0.7–4.0)
MCH: 42.4 pg — ABNORMAL HIGH (ref 26.0–34.0)
MCHC: 34.3 g/dL (ref 30.0–36.0)
MCV: 123.7 fL — ABNORMAL HIGH (ref 80.0–100.0)
Monocytes Absolute: 0.2 K/uL (ref 0.1–1.0)
Monocytes Relative: 9 %
Neutro Abs: 1.7 K/uL (ref 1.7–7.7)
Neutrophils Relative %: 61 %
Platelet Count: 217 K/uL (ref 150–400)
RBC: 2.62 MIL/uL — ABNORMAL LOW (ref 4.22–5.81)
RDW: 15.5 % (ref 11.5–15.5)
WBC Count: 2.7 K/uL — ABNORMAL LOW (ref 4.0–10.5)
nRBC: 0 % (ref 0.0–0.2)

## 2023-08-03 MED ORDER — HYDROXYUREA 500 MG PO CAPS: ORAL_CAPSULE | ORAL | 1 refills | Status: DC

## 2023-08-03 NOTE — Progress Notes (Signed)
  [facility] OFFICE PROGRESS NOTE   Diagnosis: Essential thrombocytosis  INTERVAL HISTORY:   Mr. Dibello returns as scheduled.  He continues hydroxyurea.  No bleeding or symptom of thrombosis.  Objective:  Vital signs in last 24 hours:  Blood pressure 123/70, pulse 72, temperature 98.2 F (36.8 C), temperature source Temporal, resp. rate 18, height 6' (1.829 m), weight 164 lb 8 oz (74.6 kg), SpO2 100%.    HEENT: No thrush or ulcers Resp: Distant breath sounds with bilateral expiratory wheeze, no respiratory distress Cardio: Regular rate and rhythm GI: No hepatosplenomegaly Vascular: No leg edema   Lab Results:  Lab Results  Component Value Date   WBC 2.7 (L) 08/03/2023   HGB 11.1 (L) 08/03/2023   HCT 32.4 (L) 08/03/2023   MCV 123.7 (H) 08/03/2023   PLT 217 08/03/2023   NEUTROABS 1.7 08/03/2023    CMP  Lab Results  Component Value Date   NA 137 04/06/2023   K 4.2 04/06/2023   CL 104 04/06/2023   CO2 26 04/06/2023   GLUCOSE 118 (H) 04/06/2023   BUN 20 04/06/2023   CREATININE 1.19 04/06/2023   CALCIUM 8.9 04/06/2023   PROT 6.1 (L) 04/06/2023   ALBUMIN 3.8 04/06/2023   AST 12 (L) 04/06/2023   ALT 11 04/06/2023   ALKPHOS 64 04/06/2023   BILITOT 0.7 04/06/2023   GFRNONAA >60 04/06/2023   GFRAA 90 09/24/2007     Medications: I have reviewed the patient's current medications.   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 Ultrasound abdomen 03/17/2021-splenomegaly Hydroxyurea 500 mg daily on Monday-Friday beginning 03/28/2021 Hydroxyurea 500 mg daily beginning 04/15/2021 Hydroxyurea 1000 mg Monday, Wednesday, and Friday, 500 mg other days 09/09/2021 Hydroxyurea 1000 mg Monday-Friday, 500 mg Saturday/Sunday, 10/21/2021 Hydroxyurea placed on hold 12/02/2021 Hydroxyurea resumed-500 mg daily 07/24/2022 Hydroxyurea increased to 1000 mg Monday and Thursday, 500 mg other days 09/01/2022 Hydroxyurea increased to 1000 mg  daily except for 500 mg on Monday and Thursday 10/13/2022 Hydroxyurea changed to 500 mg daily except for 1000 mg on Monday and Thursday, 08/03/2023 Weight loss COPD Tobacco use Hypertension Diabetes Hypercholesterolemia Reflux Depression/anxiety     Disposition: Shane Adams appears stable.  The platelet count, white count, and hemoglobin are lower today.  He will decrease the hydroxyurea dose to 500 mg daily except for 1000 mg on Monday and Thursday.  He will return for a CBC in 5 weeks and an office visit in 10 weeks.  Thornton Papas, MD  08/03/2023  8:06 AM

## 2023-08-19 ENCOUNTER — Encounter: Payer: Self-pay | Admitting: Internal Medicine

## 2023-08-19 DIAGNOSIS — R7989 Other specified abnormal findings of blood chemistry: Secondary | ICD-10-CM | POA: Insufficient documentation

## 2023-08-19 NOTE — Patient Instructions (Addendum)
      Blood work was ordered.       Medications changes include :   taper off fluoxetine , then start lexapro  10 mg daily      Return in about 6 months (around 02/17/2024) for Physical Exam.

## 2023-08-19 NOTE — Progress Notes (Signed)
 Subjective:    Patient ID: Shane Adams, male    DOB: Apr 21, 1956, 68 y.o.   MRN: [id number]     HPI Shane Adams is here for follow up of his chronic medical problems.    Medications and allergies reviewed with patient and updated if appropriate.  Current Outpatient Medications on File Prior to Visit  Medication Sig Dispense Refill   aspirin  EC 81 MG tablet Take 1 tablet (81 mg total) by mouth daily.     atorvastatin  (LIPITOR) 20 MG tablet Take 1 tablet (20 mg total) by mouth daily. 90 tablet 3   clobetasol cream (TEMOVATE) 0.05 % Apply topically 2 (two) times daily.     fluocinolone (SYNALAR) 0.01 % external solution APPLY SOLUTION TOPICALLY ONCE DAILY TO AFFECTED AREA FOR 6 WEEKS THEN AS NEEDED FOR FLARES     FLUoxetine  (PROZAC ) 40 MG capsule Take 1 capsule (40 mg total) by mouth daily. 90 capsule 3   fluticasone -salmeterol (ADVAIR) 100-50 MCG/ACT AEPB Inhale 1 puff into the lungs 2 (two) times daily. 60 each 11   hydroxyurea  (HYDREA ) 500 MG capsule TAKE 1 CAPSULES BY MOUTH ONCE DAILY EXCEPT 2 CAPSULES ON MONDAY AND THURSDAY. TAKE WITH FOOD 112 capsule 1   metFORMIN  (GLUCOPHAGE ) 500 MG tablet 1/2 tablet daily with largest meal 45 tablet 3   Multiple Vitamin (MULTIVITAMIN) tablet Take 1 tablet by mouth daily.     omeprazole  (PRILOSEC) 20 MG capsule Take 1 capsule (20 mg total) by mouth daily. 90 capsule 3   No current facility-administered medications on file prior to visit.     Review of Systems  Constitutional:  Negative for fever.  Respiratory:  Positive for shortness of breath and wheezing. Negative for cough.   Cardiovascular:  Negative for chest pain, palpitations and leg swelling.  Neurological:  Negative for light-headedness and headaches.       Objective:   Vitals:   08/20/23 0809  BP: 124/68  Pulse: 78  Temp: 98 F (36.7 C)  SpO2: 97%   BP Readings from Last 3 Encounters:  08/20/23 124/68  08/03/23 123/70  04/06/23 105/66   Wt Readings from Last 3  Encounters:  08/20/23 168 lb (76.2 kg)  08/03/23 164 lb 8 oz (74.6 kg)  04/06/23 163 lb 11.2 oz (74.3 kg)   Body mass index is 22.78 kg/m.    Physical Exam Constitutional:      General: He is not in acute distress.    Appearance: Normal appearance. He is not ill-appearing.  HENT:     Head: Normocephalic and atraumatic.  Eyes:     Conjunctiva/sclera: Conjunctivae normal.  Cardiovascular:     Rate and Rhythm: Normal rate and regular rhythm.     Heart sounds: Normal heart sounds.  Pulmonary:     Effort: Pulmonary effort is normal. No respiratory distress.     Breath sounds: Normal breath sounds. No wheezing or rales.  Musculoskeletal:     Right lower leg: No edema.     Left lower leg: No edema.  Skin:    General: Skin is warm and dry.     Findings: No rash.  Neurological:     Mental Status: He is alert. Mental status is at baseline.  Psychiatric:        Mood and Affect: Mood normal.        Lab Results  Component Value Date   WBC 2.7 (L) 08/03/2023   HGB 11.1 (L) 08/03/2023   HCT 32.4 (L) 08/03/2023  PLT 217 08/03/2023   GLUCOSE 118 (H) 04/06/2023   CHOL 97 02/16/2023   TRIG 57.0 02/16/2023   HDL 45.40 02/16/2023   LDLDIRECT 142.6 10/13/2010   LDLCALC 40 02/16/2023   ALT 11 04/06/2023   AST 12 (L) 04/06/2023   NA 137 04/06/2023   K 4.2 04/06/2023   CL 104 04/06/2023   CREATININE 1.19 04/06/2023   BUN 20 04/06/2023   CO2 26 04/06/2023   TSH 2.37 02/16/2023   PSA 3.04 02/16/2023   HGBA1C 5.3 02/16/2023   MICROALBUR 2.1 (H) 08/11/2022     Assessment & Plan:    See Problem List for Assessment and Plan of chronic medical problems.

## 2023-08-20 ENCOUNTER — Ambulatory Visit: Payer: Medicare PPO | Admitting: Internal Medicine

## 2023-08-20 ENCOUNTER — Telehealth: Payer: Self-pay | Admitting: Internal Medicine

## 2023-08-20 VITALS — BP 124/68 | HR 78 | Temp 98.0°F | Ht 72.0 in | Wt 168.0 lb

## 2023-08-20 DIAGNOSIS — Z7984 Long term (current) use of oral hypoglycemic drugs: Secondary | ICD-10-CM

## 2023-08-20 DIAGNOSIS — E782 Mixed hyperlipidemia: Secondary | ICD-10-CM | POA: Diagnosis not present

## 2023-08-20 DIAGNOSIS — D75839 Thrombocytosis, unspecified: Secondary | ICD-10-CM

## 2023-08-20 DIAGNOSIS — F419 Anxiety disorder, unspecified: Secondary | ICD-10-CM | POA: Diagnosis not present

## 2023-08-20 DIAGNOSIS — F32A Depression, unspecified: Secondary | ICD-10-CM | POA: Diagnosis not present

## 2023-08-20 DIAGNOSIS — E1169 Type 2 diabetes mellitus with other specified complication: Secondary | ICD-10-CM

## 2023-08-20 DIAGNOSIS — R7989 Other specified abnormal findings of blood chemistry: Secondary | ICD-10-CM | POA: Diagnosis not present

## 2023-08-20 DIAGNOSIS — K219 Gastro-esophageal reflux disease without esophagitis: Secondary | ICD-10-CM | POA: Diagnosis not present

## 2023-08-20 DIAGNOSIS — I10 Essential (primary) hypertension: Secondary | ICD-10-CM | POA: Diagnosis not present

## 2023-08-20 DIAGNOSIS — J449 Chronic obstructive pulmonary disease, unspecified: Secondary | ICD-10-CM | POA: Diagnosis not present

## 2023-08-20 LAB — COMPREHENSIVE METABOLIC PANEL
ALT: 12 U/L (ref 0–53)
AST: 13 U/L (ref 0–37)
Albumin: 3.8 g/dL (ref 3.5–5.2)
Alkaline Phosphatase: 79 U/L (ref 39–117)
BUN: 20 mg/dL (ref 6–23)
CO2: 30 meq/L (ref 19–32)
Calcium: 8.6 mg/dL (ref 8.4–10.5)
Chloride: 103 meq/L (ref 96–112)
Creatinine, Ser: 1.19 mg/dL (ref 0.40–1.50)
GFR: 62.36 mL/min (ref >60.00)
Glucose, Bld: 106 mg/dL — ABNORMAL HIGH (ref 70–99)
Potassium: 4.3 meq/L (ref 3.5–5.1)
Sodium: 139 meq/L (ref 135–145)
Total Bilirubin: 0.7 mg/dL (ref 0.2–1.2)
Total Protein: 6.3 g/dL (ref 6.0–8.3)

## 2023-08-20 LAB — LIPID PANEL
Cholesterol: 106 mg/dL (ref 0–200)
HDL: 55.2 mg/dL (ref >39.00)
LDL Cholesterol: 42 mg/dL (ref 0–99)
NonHDL: 50.81
Total CHOL/HDL Ratio: 2
Triglycerides: 42 mg/dL (ref 0.0–149.0)
VLDL: 8.4 mg/dL (ref 0.0–40.0)

## 2023-08-20 LAB — VITAMIN B12: Vitamin B-12: 158 pg/mL — ABNORMAL LOW (ref 211–911)

## 2023-08-20 LAB — HEMOGLOBIN A1C: Hgb A1c MFr Bld: 5.1 % (ref 4.6–6.5)

## 2023-08-20 MED ORDER — ESCITALOPRAM OXALATE 10 MG PO TABS: 10.0000 mg | ORAL_TABLET | Freq: Every day | ORAL | 5 refills | Status: DC

## 2023-08-20 NOTE — Assessment & Plan Note (Signed)
 Chronic Following with hematology/oncology On hydroxyurea  500 mg daily x 5 days/ week, 1000 mg x 2 days / week

## 2023-08-20 NOTE — Assessment & Plan Note (Addendum)
 Chronic Controlled when he takes his medication Currently Pepcid 20 mg daily prn, omeprazole  20 mg daily

## 2023-08-20 NOTE — Assessment & Plan Note (Signed)
 Chronic With hyperlipidemia  Lab Results  Component Value Date   HGBA1C 5.3 02/16/2023   Sugars well controlled Check A1c, urine microalbumin today Continue metformin  250 mg daily Stressed regular exercise, diabetic diet

## 2023-08-20 NOTE — Assessment & Plan Note (Addendum)
 Chronic Anxiety not controlled, denies depression Taper off fluoxetine  40 mg daily then start lexparo 10 mg daily

## 2023-08-20 NOTE — Assessment & Plan Note (Signed)
Chronic Regular exercise and healthy diet encouraged Check lipid panel, CMP Continue atorvastatin 20 mg daily 

## 2023-08-20 NOTE — Telephone Encounter (Signed)
 Its ok will do at next visit

## 2023-08-20 NOTE — Assessment & Plan Note (Addendum)
 Chronic He is still actively smoking-encouraged smoking cessation, but he has no interest in quitting Not using advair daily Start  Advair 100/50 mcg twice daily

## 2023-08-20 NOTE — Assessment & Plan Note (Addendum)
Chronic Taking a MVI Check B12 level

## 2023-08-20 NOTE — Assessment & Plan Note (Signed)
 Chronic Blood pressure well controlled  CMP Last visit we stopped lisinopril  5 mg daily No medication needed - lifestyle controlled

## 2023-08-20 NOTE — Telephone Encounter (Signed)
 Shane Adams form the lab states the pt refused to give urine for his Microalbumin lab.  Please contact pt if he needs to complete this, or call Shane Adams if it is not needed.   Pt: [phone]  Shane Adams: [phone]

## 2023-08-21 ENCOUNTER — Encounter: Payer: Self-pay | Admitting: Internal Medicine

## 2023-09-07 ENCOUNTER — Inpatient Hospital Stay: Payer: Medicare PPO | Attending: Oncology

## 2023-09-07 ENCOUNTER — Telehealth: Payer: Self-pay | Admitting: *Deleted

## 2023-09-07 DIAGNOSIS — D75839 Thrombocytosis, unspecified: Secondary | ICD-10-CM

## 2023-09-07 DIAGNOSIS — D473 Essential (hemorrhagic) thrombocythemia: Secondary | ICD-10-CM | POA: Insufficient documentation

## 2023-09-07 LAB — CBC WITH DIFFERENTIAL (CANCER CENTER ONLY)
Abs Immature Granulocytes: 0.01 10*3/uL (ref 0.00–0.07)
Basophils Absolute: 0 10*3/uL (ref 0.0–0.1)
Basophils Relative: 1 %
Eosinophils Absolute: 0 10*3/uL (ref 0.0–0.5)
Eosinophils Relative: 1 %
HCT: 32.7 % — ABNORMAL LOW (ref 39.0–52.0)
Hemoglobin: 11.1 g/dL — ABNORMAL LOW (ref 13.0–17.0)
Immature Granulocytes: 0 %
Lymphocytes Relative: 33 %
Lymphs Abs: 0.9 10*3/uL (ref 0.7–4.0)
MCH: 42.2 pg — ABNORMAL HIGH (ref 26.0–34.0)
MCHC: 33.9 g/dL (ref 30.0–36.0)
MCV: 124.3 fL — ABNORMAL HIGH (ref 80.0–100.0)
Monocytes Absolute: 0.2 10*3/uL (ref 0.1–1.0)
Monocytes Relative: 7 %
Neutro Abs: 1.7 10*3/uL (ref 1.7–7.7)
Neutrophils Relative %: 58 %
Platelet Count: 257 10*3/uL (ref 150–400)
RBC: 2.63 MIL/uL — ABNORMAL LOW (ref 4.22–5.81)
RDW: 11.9 % (ref 11.5–15.5)
WBC Count: 2.9 10*3/uL — ABNORMAL LOW (ref 4.0–10.5)
nRBC: 0 % (ref 0.0–0.2)

## 2023-09-07 NOTE — Telephone Encounter (Signed)
-----   Message from Thornton Papas sent at 09/07/2023  3:15 PM EST ----- Please call patient, the hemoglobin and white count remained mildly low, platelets are in goal range, reduce hydroxyurea to 500 mg daily, follow-up as scheduled

## 2023-09-07 NOTE — Telephone Encounter (Signed)
 LVM for patient to check MyChart regarding lab results and recommendations from MD.

## 2023-09-14 ENCOUNTER — Other Ambulatory Visit: Payer: Self-pay | Admitting: Oncology

## 2023-09-14 DIAGNOSIS — D75839 Thrombocytosis, unspecified: Secondary | ICD-10-CM

## 2023-11-02 ENCOUNTER — Inpatient Hospital Stay: Payer: Medicare PPO | Attending: Oncology

## 2023-11-02 ENCOUNTER — Encounter: Payer: Self-pay | Admitting: Oncology

## 2023-11-02 ENCOUNTER — Telehealth: Payer: Self-pay | Admitting: Oncology

## 2023-11-02 ENCOUNTER — Inpatient Hospital Stay: Payer: Medicare PPO | Admitting: Oncology

## 2023-11-02 VITALS — BP 104/62 | HR 75 | Temp 98.2°F | Resp 18 | Ht 72.0 in | Wt 168.5 lb

## 2023-11-02 DIAGNOSIS — D75839 Thrombocytosis, unspecified: Secondary | ICD-10-CM

## 2023-11-02 DIAGNOSIS — D473 Essential (hemorrhagic) thrombocythemia: Secondary | ICD-10-CM | POA: Diagnosis not present

## 2023-11-02 DIAGNOSIS — E1159 Type 2 diabetes mellitus with other circulatory complications: Secondary | ICD-10-CM | POA: Diagnosis not present

## 2023-11-02 DIAGNOSIS — J449 Chronic obstructive pulmonary disease, unspecified: Secondary | ICD-10-CM | POA: Insufficient documentation

## 2023-11-02 DIAGNOSIS — F17200 Nicotine dependence, unspecified, uncomplicated: Secondary | ICD-10-CM | POA: Insufficient documentation

## 2023-11-02 DIAGNOSIS — I10 Essential (primary) hypertension: Secondary | ICD-10-CM | POA: Diagnosis not present

## 2023-11-02 DIAGNOSIS — K219 Gastro-esophageal reflux disease without esophagitis: Secondary | ICD-10-CM | POA: Diagnosis not present

## 2023-11-02 LAB — CBC WITH DIFFERENTIAL (CANCER CENTER ONLY)
Abs Immature Granulocytes: 0.03 10*3/uL (ref 0.00–0.07)
Basophils Absolute: 0.1 10*3/uL (ref 0.0–0.1)
Basophils Relative: 1 %
Eosinophils Absolute: 0.2 10*3/uL (ref 0.0–0.5)
Eosinophils Relative: 3 %
HCT: 38.2 % — ABNORMAL LOW (ref 39.0–52.0)
Hemoglobin: 12.9 g/dL — ABNORMAL LOW (ref 13.0–17.0)
Immature Granulocytes: 0 %
Lymphocytes Relative: 13 %
Lymphs Abs: 1.1 10*3/uL (ref 0.7–4.0)
MCH: 37.9 pg — ABNORMAL HIGH (ref 26.0–34.0)
MCHC: 33.8 g/dL (ref 30.0–36.0)
MCV: 112.4 fL — ABNORMAL HIGH (ref 80.0–100.0)
Monocytes Absolute: 0.6 10*3/uL (ref 0.1–1.0)
Monocytes Relative: 7 %
Neutro Abs: 6.1 10*3/uL (ref 1.7–7.7)
Neutrophils Relative %: 76 %
Platelet Count: 398 10*3/uL (ref 150–400)
RBC: 3.4 MIL/uL — ABNORMAL LOW (ref 4.22–5.81)
RDW: 11.5 % (ref 11.5–15.5)
WBC Count: 8.1 10*3/uL (ref 4.0–10.5)
nRBC: 0 % (ref 0.0–0.2)

## 2023-11-02 NOTE — Telephone Encounter (Signed)
 Patient has been scheduled for follow-up visit per 11/02/23 LOS.  Pt aware of scheduled appt details.

## 2023-11-02 NOTE — Progress Notes (Signed)
  [facility] OFFICE PROGRESS NOTE   Diagnosis: Essential thrombocytosis  INTERVAL HISTORY:   Shane Adams returns as scheduled.  He continues hydroxyurea .  No mouth sores, nausea, bleeding, or symptom of thrombosis.  He has increased dyspnea.  He has been smoking more.  Objective:  Vital signs in last 24 hours:  Blood pressure 104/62, pulse 75, temperature 98.2 F (36.8 C), temperature source Temporal, resp. rate 18, height 6' (1.829 m), weight 168 lb 8 oz (76.4 kg), SpO2 98%.    HEENT: No thrush or ulcers Resp: Distant breath sounds, bilateral expiratory wheeze, no respiratory distress Cardio: Regular rate and rhythm GI: No hepatosplenomegaly Vascular: No leg edema   Lab Results:  Lab Results  Component Value Date   WBC 8.1 11/02/2023   HGB 12.9 (L) 11/02/2023   HCT 38.2 (L) 11/02/2023   MCV 112.4 (H) 11/02/2023   PLT 398 11/02/2023   NEUTROABS 6.1 11/02/2023    CMP  Lab Results  Component Value Date   NA 139 08/20/2023   K 4.3 08/20/2023   CL 103 08/20/2023   CO2 30 08/20/2023   GLUCOSE 106 (H) 08/20/2023   BUN 20 08/20/2023   CREATININE 1.19 08/20/2023   CALCIUM  8.6 08/20/2023   PROT 6.3 08/20/2023   ALBUMIN 3.8 08/20/2023   AST 13 08/20/2023   ALT 12 08/20/2023   ALKPHOS 79 08/20/2023   BILITOT 0.7 08/20/2023   GFRNONAA >60 04/06/2023   GFRAA 90 09/24/2007    Medications: I have reviewed the patient's current medications.   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 Ultrasound abdomen 03/17/2021-splenomegaly Hydroxyurea  500 mg daily on Monday-Friday beginning 03/28/2021 Hydroxyurea  500 mg daily beginning 04/15/2021 Hydroxyurea  1000 mg Monday, Wednesday, and Friday, 500 mg other days 09/09/2021 Hydroxyurea  1000 mg Monday-Friday, 500 mg Saturday/Sunday, 10/21/2021 Hydroxyurea  placed on hold 12/02/2021 Hydroxyurea  resumed-500 mg daily 07/24/2022 Hydroxyurea  increased to 1000 mg Monday and Thursday, 500 mg  other days 09/01/2022 Hydroxyurea  increased to 1000 mg daily except for 500 mg on Monday and Thursday 10/13/2022 Hydroxyurea  changed to 500 mg daily except for 1000 mg on Monday and Thursday, 08/03/2023 Hydroxyurea  changed to 500 mg daily 09/07/2023 Weight loss COPD Tobacco use Hypertension Diabetes Hypercholesterolemia Reflux Depression/anxiety      Disposition: Shane Adams appears stable.  The white count and hemoglobin are higher with the reduced hydroxyurea  dose.  The platelet count remains in goal range.  He will continue hydroxyurea  at the current dose.  He will return for a lab visit in 2 months and an office visit in 4 months.  I recommended he decrease cigarette use and see Dr. Donnette Gal to discuss the dyspnea.  He may be a candidate for a different inhaler regimen.  Coni Deep, MD  11/02/2023  8:15 AM

## 2023-11-20 ENCOUNTER — Other Ambulatory Visit: Payer: Self-pay | Admitting: Oncology

## 2023-11-20 DIAGNOSIS — D75839 Thrombocytosis, unspecified: Secondary | ICD-10-CM

## 2024-01-04 ENCOUNTER — Ambulatory Visit: Payer: Self-pay | Admitting: Oncology

## 2024-01-04 ENCOUNTER — Inpatient Hospital Stay: Attending: Oncology

## 2024-01-04 DIAGNOSIS — D473 Essential (hemorrhagic) thrombocythemia: Secondary | ICD-10-CM | POA: Insufficient documentation

## 2024-01-04 DIAGNOSIS — D75839 Thrombocytosis, unspecified: Secondary | ICD-10-CM

## 2024-01-04 LAB — CBC WITH DIFFERENTIAL (CANCER CENTER ONLY)
Abs Immature Granulocytes: 0.04 10*3/uL (ref 0.00–0.07)
Basophils Absolute: 0.1 10*3/uL (ref 0.0–0.1)
Basophils Relative: 1 %
Eosinophils Absolute: 0.1 10*3/uL (ref 0.0–0.5)
Eosinophils Relative: 3 %
HCT: 37.8 % — ABNORMAL LOW (ref 39.0–52.0)
Hemoglobin: 12.8 g/dL — ABNORMAL LOW (ref 13.0–17.0)
Immature Granulocytes: 1 %
Lymphocytes Relative: 22 %
Lymphs Abs: 1.1 10*3/uL (ref 0.7–4.0)
MCH: 37 pg — ABNORMAL HIGH (ref 26.0–34.0)
MCHC: 33.9 g/dL (ref 30.0–36.0)
MCV: 109.2 fL — ABNORMAL HIGH (ref 80.0–100.0)
Monocytes Absolute: 0.3 10*3/uL (ref 0.1–1.0)
Monocytes Relative: 7 %
Neutro Abs: 3.3 10*3/uL (ref 1.7–7.7)
Neutrophils Relative %: 66 %
Platelet Count: 293 10*3/uL (ref 150–400)
RBC: 3.46 MIL/uL — ABNORMAL LOW (ref 4.22–5.81)
RDW: 13.5 % (ref 11.5–15.5)
WBC Count: 4.9 10*3/uL (ref 4.0–10.5)
nRBC: 0 % (ref 0.0–0.2)

## 2024-02-13 ENCOUNTER — Other Ambulatory Visit: Payer: Self-pay | Admitting: Internal Medicine

## 2024-03-02 ENCOUNTER — Encounter: Payer: Self-pay | Admitting: Internal Medicine

## 2024-03-02 NOTE — Patient Instructions (Addendum)
 Blood work was ordered.       Medications changes include :   increase lexapro  to 20 mg daily --- if this is not effective we can try effexor,  increase advair      Return in about 6 months (around 09/03/2024) for follow up.    Health Maintenance, Male Adopting a healthy lifestyle and getting preventive care are important in promoting health and wellness. Ask your health care provider about: The right schedule for you to have regular tests and exams. Things you can do on your own to prevent diseases and keep yourself healthy. What should I know about diet, weight, and exercise? Eat a healthy diet  Eat a diet that includes plenty of vegetables, fruits, low-fat dairy products, and lean protein. Do not eat a lot of foods that are high in solid fats, added sugars, or sodium. Maintain a healthy weight Body mass index (BMI) is a measurement that can be used to identify possible weight problems. It estimates body fat based on height and weight. Your health care provider can help determine your BMI and help you achieve or maintain a healthy weight. Get regular exercise Get regular exercise. This is one of the most important things you can do for your health. Most adults should: Exercise for at least 150 minutes each week. The exercise should increase your heart rate and make you sweat (moderate-intensity exercise). Do strengthening exercises at least twice a week. This is in addition to the moderate-intensity exercise. Spend less time sitting. Even light physical activity can be beneficial. Watch cholesterol and blood lipids Have your blood tested for lipids and cholesterol at 70 years of age, then have this test every 5 years. You may need to have your cholesterol levels checked more often if: Your lipid or cholesterol levels are high. You are older than 70 years of age. You are at high risk for heart disease. What should I know about cancer screening? Many types of cancers can  be detected early and may often be prevented. Depending on your health history and family history, you may need to have cancer screening at various ages. This may include screening for: Colorectal cancer. Prostate cancer. Skin cancer. Lung cancer. What should I know about heart disease, diabetes, and high blood pressure? Blood pressure and heart disease High blood pressure causes heart disease and increases the risk of stroke. This is more likely to develop in people who have high blood pressure readings or are overweight. Talk with your health care provider about your target blood pressure readings. Have your blood pressure checked: Every 3-5 years if you are 56-74 years of age. Every year if you are 36 years old or older. If you are between the ages of 40 and 48 and are a current or former smoker, ask your health care provider if you should have a one-time screening for abdominal aortic aneurysm (AAA). Diabetes Have regular diabetes screenings. This checks your fasting blood sugar level. Have the screening done: Once every three years after age 58 if you are at a normal weight and have a low risk for diabetes. More often and at a younger age if you are overweight or have a high risk for diabetes. What should I know about preventing infection? Hepatitis B If you have a higher risk for hepatitis B, you should be screened for this virus. Talk with your health care provider to find out if you are at risk for hepatitis B infection. Hepatitis C Blood  testing is recommended for: Everyone born from [birth year] through 1967. Anyone with known risk factors for hepatitis C. Sexually transmitted infections (STIs) You should be screened each year for STIs, including gonorrhea and chlamydia, if: You are sexually active and are younger than 70 years of age. You are older than 70 years of age and your health care provider tells you that you are at risk for this type of infection. Your sexual activity has  changed since you were last screened, and you are at increased risk for chlamydia or gonorrhea. Ask your health care provider if you are at risk. Ask your health care provider about whether you are at high risk for HIV. Your health care provider may recommend a prescription medicine to help prevent HIV infection. If you choose to take medicine to prevent HIV, you should first get tested for HIV. You should then be tested every 3 months for as long as you are taking the medicine. Follow these instructions at home: Alcohol use Do not drink alcohol if your health care provider tells you not to drink. If you drink alcohol: Limit how much you have to 0-2 drinks a day. Know how much alcohol is in your drink. In the [place] one drink equals one 12 oz bottle of beer (355 mL), one 5 oz glass of wine (148 mL), or one 1 oz glass of hard liquor (44 mL). Lifestyle Do not use any products that contain nicotine or tobacco. These products include cigarettes, chewing tobacco, and vaping devices, such as e-cigarettes. If you need help quitting, ask your health care provider. Do not use street drugs. Do not share needles. Ask your health care provider for help if you need support or information about quitting drugs. General instructions Schedule regular health, dental, and eye exams. Stay current with your vaccines. Tell your health care provider if: You often feel depressed. You have ever been abused or do not feel safe at home. Summary Adopting a healthy lifestyle and getting preventive care are important in promoting health and wellness. Follow your health care provider's instructions about healthy diet, exercising, and getting tested or screened for diseases. Follow your health care provider's instructions on monitoring your cholesterol and blood pressure. This information is not intended to replace advice given to you by your health care provider. Make sure you discuss any questions you have with your health  care provider. Document Revised: 11/15/2020 Document Reviewed: 11/15/2020 Elsevier Patient Education  2024 [employer].

## 2024-03-02 NOTE — Progress Notes (Unsigned)
 Subjective:    Patient ID: Shane Adams, male    DOB: 1956/04/08, 68 y.o.   MRN: [id number]     HPI Shane Adams is here for a physical exam and his chronic medical problems.      Medications and allergies reviewed with patient and updated if appropriate.  Current Outpatient Medications on File Prior to Visit  Medication Sig Dispense Refill   aspirin  EC 81 MG tablet Take 1 tablet (81 mg total) by mouth daily.     atorvastatin  (LIPITOR) 20 MG tablet Take 1 tablet by mouth once daily 90 tablet 0   clobetasol cream (TEMOVATE) 0.05 % Apply topically 2 (two) times daily.     cyanocobalamin  (VITAMIN B12) 1000 MCG tablet Take 1,000 mcg by mouth daily.     escitalopram  (LEXAPRO ) 10 MG tablet Take 1 tablet (10 mg total) by mouth daily. 30 tablet 5   fluocinolone (SYNALAR) 0.01 % external solution APPLY SOLUTION TOPICALLY ONCE DAILY TO AFFECTED AREA FOR 6 WEEKS THEN AS NEEDED FOR FLARES     fluticasone -salmeterol (ADVAIR) 100-50 MCG/ACT AEPB Inhale 1 puff into the lungs 2 (two) times daily. 60 each 11   hydroxyurea  (HYDREA ) 500 MG capsule Take 1 capsule by mouth once daily 90 capsule 0   metFORMIN  (GLUCOPHAGE ) 500 MG tablet 1/2 tablet daily with largest meal 45 tablet 3   Multiple Vitamin (MULTIVITAMIN) tablet Take 1 tablet by mouth daily.     omeprazole  (PRILOSEC) 20 MG capsule Take 1 capsule (20 mg total) by mouth daily. 90 capsule 3   No current facility-administered medications on file prior to visit.    Review of Systems     Objective:  There were no vitals filed for this visit. There were no vitals filed for this visit. There is no height or weight on file to calculate BMI.  BP Readings from Last 3 Encounters:  11/02/23 104/62  08/20/23 124/68  08/03/23 123/70    Wt Readings from Last 3 Encounters:  11/02/23 168 lb 8 oz (76.4 kg)  08/20/23 168 lb (76.2 kg)  08/03/23 164 lb 8 oz (74.6 kg)      Physical Exam Constitutional: He appears well-developed and  well-nourished. No distress.  HENT:  Head: Normocephalic and atraumatic.  Right Ear: External ear normal.  Left Ear: External ear normal.  Normal ear canals and TM b/l  Mouth/Throat: Oropharynx is clear and moist. Eyes: Conjunctivae and EOM are normal.  Neck: Neck supple. No tracheal deviation present. No thyromegaly present.  No carotid bruit  Cardiovascular: Normal rate, regular rhythm, normal heart sounds and intact distal pulses.   No murmur heard.  No lower extremity edema. Pulmonary/Chest: Effort normal and breath sounds normal. No respiratory distress. He has no wheezes. He has no rales.  Abdominal: Soft. He exhibits no distension. There is no tenderness.  Genitourinary: deferred  Lymphadenopathy:   He has no cervical adenopathy.  Skin: Skin is warm and dry. He is not diaphoretic.  Psychiatric: He has a normal mood and affect. His behavior is normal.         Assessment & Plan:   Physical exam: Screening blood work  ordered Exercise    Weight   Substance abuse   none   Reviewed recommended immunizations.   Health Maintenance  Topic Date Due   Medicare Annual Wellness (AWV)  Never done   Zoster Vaccines- Shingrix (1 of 2) 03/13/2004   Diabetic kidney evaluation - Urine ACR  10/02/2010   FOOT EXAM  02/04/2020  OPHTHALMOLOGY EXAM  06/07/2022   COVID-19 Vaccine (5 - 2024-25 season) 03/11/2023   Colonoscopy  04/18/2023   DTaP/Tdap/Td (3 - Td or Tdap) 01/09/2024   INFLUENZA VACCINE  02/08/2024   HEMOGLOBIN A1C  02/17/2024   Diabetic kidney evaluation - eGFR measurement  08/19/2024   Pneumococcal Vaccine: 50+ Years  Completed   Hepatitis C Screening  Completed   HPV VACCINES  Aged Out   Meningococcal B Vaccine  Aged Out     See Problem List for Assessment and Plan of chronic medical problems.

## 2024-03-03 ENCOUNTER — Ambulatory Visit: Payer: Medicare PPO | Admitting: Internal Medicine

## 2024-03-03 VITALS — BP 120/74 | HR 58 | Temp 98.0°F | Ht 72.0 in | Wt 175.0 lb

## 2024-03-03 DIAGNOSIS — J449 Chronic obstructive pulmonary disease, unspecified: Secondary | ICD-10-CM

## 2024-03-03 DIAGNOSIS — Z7984 Long term (current) use of oral hypoglycemic drugs: Secondary | ICD-10-CM

## 2024-03-03 DIAGNOSIS — Z125 Encounter for screening for malignant neoplasm of prostate: Secondary | ICD-10-CM | POA: Diagnosis not present

## 2024-03-03 DIAGNOSIS — Z0000 Encounter for general adult medical examination without abnormal findings: Secondary | ICD-10-CM

## 2024-03-03 DIAGNOSIS — R7989 Other specified abnormal findings of blood chemistry: Secondary | ICD-10-CM | POA: Diagnosis not present

## 2024-03-03 DIAGNOSIS — E782 Mixed hyperlipidemia: Secondary | ICD-10-CM

## 2024-03-03 DIAGNOSIS — M170 Bilateral primary osteoarthritis of knee: Secondary | ICD-10-CM

## 2024-03-03 DIAGNOSIS — K219 Gastro-esophageal reflux disease without esophagitis: Secondary | ICD-10-CM

## 2024-03-03 DIAGNOSIS — E1169 Type 2 diabetes mellitus with other specified complication: Secondary | ICD-10-CM | POA: Diagnosis not present

## 2024-03-03 DIAGNOSIS — E785 Hyperlipidemia, unspecified: Secondary | ICD-10-CM | POA: Diagnosis not present

## 2024-03-03 DIAGNOSIS — F419 Anxiety disorder, unspecified: Secondary | ICD-10-CM

## 2024-03-03 DIAGNOSIS — I2510 Atherosclerotic heart disease of native coronary artery without angina pectoris: Secondary | ICD-10-CM

## 2024-03-03 DIAGNOSIS — F32A Depression, unspecified: Secondary | ICD-10-CM

## 2024-03-03 LAB — COMPREHENSIVE METABOLIC PANEL WITH GFR
ALT: 14 U/L (ref 0–53)
AST: 12 U/L (ref 0–37)
Albumin: 3.6 g/dL (ref 3.5–5.2)
Alkaline Phosphatase: 85 U/L (ref 39–117)
BUN: 15 mg/dL (ref 6–23)
CO2: 31 meq/L (ref 19–32)
Calcium: 8.7 mg/dL (ref 8.4–10.5)
Chloride: 101 meq/L (ref 96–112)
Creatinine, Ser: 1.08 mg/dL (ref 0.40–1.50)
GFR: 69.79 mL/min (ref >60.00)
Glucose, Bld: 100 mg/dL — ABNORMAL HIGH (ref 70–99)
Potassium: 4.5 meq/L (ref 3.5–5.1)
Sodium: 141 meq/L (ref 135–145)
Total Bilirubin: 0.6 mg/dL (ref 0.2–1.2)
Total Protein: 6.1 g/dL (ref 6.0–8.3)

## 2024-03-03 LAB — LIPID PANEL
Cholesterol: 96 mg/dL (ref 0–200)
HDL: 30.8 mg/dL — ABNORMAL LOW (ref >39.00)
LDL Cholesterol: 53 mg/dL (ref 0–99)
NonHDL: 64.88
Total CHOL/HDL Ratio: 3
Triglycerides: 61 mg/dL (ref 0.0–149.0)
VLDL: 12.2 mg/dL (ref 0.0–40.0)

## 2024-03-03 LAB — MICROALBUMIN / CREATININE URINE RATIO
Creatinine,U: 154.1 mg/dL
Microalb Creat Ratio: 8.1 mg/g (ref 0.0–30.0)
Microalb, Ur: 1.3 mg/dL (ref 0.0–1.9)

## 2024-03-03 LAB — VITAMIN B12: Vitamin B-12: 1016 pg/mL — ABNORMAL HIGH (ref 211–911)

## 2024-03-03 LAB — PSA, MEDICARE: PSA: 4.79 ng/mL — ABNORMAL HIGH (ref 0.10–4.00)

## 2024-03-03 LAB — HEMOGLOBIN A1C: Hgb A1c MFr Bld: 6.1 % (ref 4.6–6.5)

## 2024-03-03 MED ORDER — ESCITALOPRAM OXALATE 20 MG PO TABS: 20.0000 mg | ORAL_TABLET | Freq: Every day | ORAL | 5 refills | Status: AC

## 2024-03-03 MED ORDER — FLUTICASONE-SALMETEROL 250-50 MCG/ACT IN AEPB: 1.0000 | INHALATION_SPRAY | Freq: Two times a day (BID) | RESPIRATORY_TRACT | 5 refills | Status: AC

## 2024-03-03 MED ORDER — METFORMIN HCL 500 MG PO TABS: ORAL_TABLET | ORAL | 3 refills | Status: AC

## 2024-03-03 MED ORDER — ATORVASTATIN CALCIUM 20 MG PO TABS: 20.0000 mg | ORAL_TABLET | Freq: Every day | ORAL | 0 refills | Status: AC

## 2024-03-03 MED ORDER — OMEPRAZOLE 20 MG PO CPDR: 20.0000 mg | DELAYED_RELEASE_CAPSULE | Freq: Every day | ORAL | 3 refills | Status: AC

## 2024-03-03 NOTE — Assessment & Plan Note (Signed)
 Chronic Having increased bilateral knee pain Did have an injection in one of the knees a while ago and it did help Currently using Biofreeze, taking Tylenol, occasional Advil Does not feel like he needs to see a specialist at this time

## 2024-03-03 NOTE — Assessment & Plan Note (Addendum)
Chronic Regular exercise and healthy diet encouraged Check lipid panel, CMP Continue atorvastatin 20 mg daily 

## 2024-03-03 NOTE — Assessment & Plan Note (Signed)
Chronic Taking B12 supplementation Check B12 level

## 2024-03-03 NOTE — Assessment & Plan Note (Addendum)
 Chronic With hyperlipidemia  Lab Results  Component Value Date   HGBA1C 5.1 08/20/2023   Sugars well controlled Check A1c, urine albumin/creatinine ratio  Continue metformin  250 mg daily Not compliant with a diabetic diet and unlikely to change diet so we will continue metformin 

## 2024-03-03 NOTE — Assessment & Plan Note (Addendum)
 Chronic Not controlled  Increase lexapro  to 20 mg daily -- if not effective after 4-5 weeks can try effexor

## 2024-03-03 NOTE — Assessment & Plan Note (Signed)
 Chronic Coronary atherosclerosis seen on imaging No symptoms consistent with angina Continue aspirin  81 mg daily, atorvastatin  20 mg daily Blood pressure well controlled Sugars controlled Regular exercise, healthy diet Encouraged smoking cessation Check lipid panel

## 2024-03-03 NOTE — Assessment & Plan Note (Signed)
 Chronic Controlled when he takes his medication Continue omeprazole  20 mg daily

## 2024-03-03 NOTE — Assessment & Plan Note (Addendum)
 Chronic He is still actively smoking-encouraged him to cut back on how much he is smoking Diffuse expiratory wheezing on exam Using  Advair 100/50 mcg twice daily which does help some Increase Advair to 250-50 mcg/act twice daily

## 2024-03-05 ENCOUNTER — Ambulatory Visit: Payer: Self-pay | Admitting: Internal Medicine

## 2024-03-05 DIAGNOSIS — Z125 Encounter for screening for malignant neoplasm of prostate: Secondary | ICD-10-CM

## 2024-03-07 ENCOUNTER — Inpatient Hospital Stay

## 2024-03-07 ENCOUNTER — Inpatient Hospital Stay: Attending: Oncology | Admitting: Oncology

## 2024-03-07 ENCOUNTER — Other Ambulatory Visit: Payer: Self-pay | Admitting: *Deleted

## 2024-03-07 ENCOUNTER — Encounter: Payer: Self-pay | Admitting: Oncology

## 2024-03-07 VITALS — BP 128/64 | HR 65 | Temp 98.1°F | Resp 18 | Ht 72.0 in | Wt 177.8 lb

## 2024-03-07 DIAGNOSIS — D473 Essential (hemorrhagic) thrombocythemia: Secondary | ICD-10-CM | POA: Diagnosis not present

## 2024-03-07 DIAGNOSIS — F32A Depression, unspecified: Secondary | ICD-10-CM | POA: Diagnosis not present

## 2024-03-07 DIAGNOSIS — D75839 Thrombocytosis, unspecified: Secondary | ICD-10-CM

## 2024-03-07 DIAGNOSIS — F419 Anxiety disorder, unspecified: Secondary | ICD-10-CM | POA: Insufficient documentation

## 2024-03-07 DIAGNOSIS — J449 Chronic obstructive pulmonary disease, unspecified: Secondary | ICD-10-CM | POA: Insufficient documentation

## 2024-03-07 DIAGNOSIS — Z720 Tobacco use: Secondary | ICD-10-CM | POA: Diagnosis not present

## 2024-03-07 DIAGNOSIS — E1159 Type 2 diabetes mellitus with other circulatory complications: Secondary | ICD-10-CM | POA: Insufficient documentation

## 2024-03-07 DIAGNOSIS — K219 Gastro-esophageal reflux disease without esophagitis: Secondary | ICD-10-CM | POA: Insufficient documentation

## 2024-03-07 DIAGNOSIS — I10 Essential (primary) hypertension: Secondary | ICD-10-CM | POA: Insufficient documentation

## 2024-03-07 DIAGNOSIS — E7800 Pure hypercholesterolemia, unspecified: Secondary | ICD-10-CM | POA: Insufficient documentation

## 2024-03-07 LAB — CBC WITH DIFFERENTIAL (CANCER CENTER ONLY)
Abs Immature Granulocytes: 0.05 K/uL (ref 0.00–0.07)
Basophils Absolute: 0.1 K/uL (ref 0.0–0.1)
Basophils Relative: 1 %
Eosinophils Absolute: 0.2 K/uL (ref 0.0–0.5)
Eosinophils Relative: 2 %
HCT: 36.5 % — ABNORMAL LOW (ref 39.0–52.0)
Hemoglobin: 12.4 g/dL — ABNORMAL LOW (ref 13.0–17.0)
Immature Granulocytes: 1 %
Lymphocytes Relative: 17 %
Lymphs Abs: 1.1 K/uL (ref 0.7–4.0)
MCH: 37.1 pg — ABNORMAL HIGH (ref 26.0–34.0)
MCHC: 34 g/dL (ref 30.0–36.0)
MCV: 109.3 fL — ABNORMAL HIGH (ref 80.0–100.0)
Monocytes Absolute: 0.5 K/uL (ref 0.1–1.0)
Monocytes Relative: 7 %
Neutro Abs: 4.8 K/uL (ref 1.7–7.7)
Neutrophils Relative %: 72 %
Platelet Count: 430 K/uL — ABNORMAL HIGH (ref 150–400)
RBC: 3.34 MIL/uL — ABNORMAL LOW (ref 4.22–5.81)
RDW: 12.6 % (ref 11.5–15.5)
WBC Count: 6.6 K/uL (ref 4.0–10.5)
nRBC: 0 % (ref 0.0–0.2)

## 2024-03-07 MED ORDER — HYDROXYUREA 500 MG PO CAPS: 500.0000 mg | ORAL_CAPSULE | Freq: Every day | ORAL | 0 refills | Status: DC

## 2024-03-07 NOTE — Progress Notes (Signed)
   [facility] PROGRESS NOTE   Diagnosis: Essential thrombocytosis  INTERVAL HISTORY:   Shane Adams returns as scheduled.  He continues hydroxyurea .  He feels well.  No mouth sores, nausea, or diarrhea.  No bleeding or symptom of thrombosis.  No complaint.  Objective:  Vital signs in last 24 hours:  Blood pressure 128/64, pulse 65, temperature 98.1 F (36.7 C), temperature source Temporal, resp. rate 18, height 6' (1.829 m), weight 177 lb 12.8 oz (80.6 kg), SpO2 99%.    HEENT: No thrush or ulcers Resp: Lungs with bilateral inspiratory expiratory wheeze/rhonchi and distant breath sounds, no respiratory distress Cardio: Distant heart sounds, regular rhythm GI: No hepatosplenomegaly Vascular: No leg edema  Lab Results:  Lab Results  Component Value Date   WBC 6.6 03/07/2024   HGB 12.4 (L) 03/07/2024   HCT 36.5 (L) 03/07/2024   MCV 109.3 (H) 03/07/2024   PLT 430 (H) 03/07/2024   NEUTROABS 4.8 03/07/2024    CMP  Lab Results  Component Value Date   NA 141 03/03/2024   K 4.5 03/03/2024   CL 101 03/03/2024   CO2 31 03/03/2024   GLUCOSE 100 (H) 03/03/2024   BUN 15 03/03/2024   CREATININE 1.08 03/03/2024   CALCIUM  8.7 03/03/2024   PROT 6.1 03/03/2024   ALBUMIN 3.6 03/03/2024   AST 12 03/03/2024   ALT 14 03/03/2024   ALKPHOS 85 03/03/2024   BILITOT 0.6 03/03/2024   GFRNONAA >60 04/06/2023   GFRAA 90 09/24/2007    No results found for: CEA1, CEA, CAN199, CA125  No results found for: INR, LABPROT  Imaging:  No results found.  Medications: I have reviewed the patient's current medications.   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 Ultrasound abdomen 03/17/2021-splenomegaly Hydroxyurea  500 mg daily on Monday-Friday beginning 03/28/2021 Hydroxyurea  500 mg daily beginning 04/15/2021 Hydroxyurea  1000 mg Monday, Wednesday, and Friday, 500 mg other days 09/09/2021 Hydroxyurea  1000 mg Monday-Friday,  500 mg Saturday/Sunday, 10/21/2021 Hydroxyurea  placed on hold 12/02/2021 Hydroxyurea  resumed-500 mg daily 07/24/2022 Hydroxyurea  increased to 1000 mg Monday and Thursday, 500 mg other days 09/01/2022 Hydroxyurea  increased to 1000 mg daily except for 500 mg on Monday and Thursday 10/13/2022 Hydroxyurea  changed to 500 mg daily except for 1000 mg on Monday and Thursday, 08/03/2023 Hydroxyurea  changed to 500 mg daily 09/07/2023 Weight loss COPD Tobacco use Hypertension Diabetes Hypercholesterolemia Reflux Depression/anxiety     Disposition: Shane Adams appears stable.  The platelet count is at the upper end of the goal range.  He is tolerating the hydroxyurea  well.  He will continue hydroxyurea  at the current dose.  He will return for a lab visit in 3 months and an office visit in 6 months.  We will adjust the hydroxyurea  dose if the platelet count is higher when he is here in 3 months.    Arley Hof, MD  03/07/2024  8:06 AM

## 2024-03-08 ENCOUNTER — Other Ambulatory Visit: Payer: Self-pay | Admitting: Internal Medicine

## 2024-03-19 ENCOUNTER — Other Ambulatory Visit: Payer: Self-pay | Admitting: Internal Medicine

## 2024-03-24 DIAGNOSIS — H353111 Nonexudative age-related macular degeneration, right eye, early dry stage: Secondary | ICD-10-CM | POA: Diagnosis not present

## 2024-03-24 DIAGNOSIS — H26492 Other secondary cataract, left eye: Secondary | ICD-10-CM | POA: Diagnosis not present

## 2024-04-03 ENCOUNTER — Other Ambulatory Visit (INDEPENDENT_AMBULATORY_CARE_PROVIDER_SITE_OTHER)

## 2024-04-03 DIAGNOSIS — Z125 Encounter for screening for malignant neoplasm of prostate: Secondary | ICD-10-CM

## 2024-04-03 LAB — PSA, MEDICARE: PSA: 3.99 ng/mL (ref 0.10–4.00)

## 2024-04-05 ENCOUNTER — Ambulatory Visit: Payer: Self-pay | Admitting: Internal Medicine

## 2024-04-05 DIAGNOSIS — R972 Elevated prostate specific antigen [PSA]: Secondary | ICD-10-CM

## 2024-04-25 DIAGNOSIS — Z23 Encounter for immunization: Secondary | ICD-10-CM | POA: Diagnosis not present

## 2024-05-30 ENCOUNTER — Ambulatory Visit: Payer: Self-pay | Admitting: Oncology

## 2024-05-30 ENCOUNTER — Inpatient Hospital Stay: Attending: Oncology

## 2024-05-30 DIAGNOSIS — R972 Elevated prostate specific antigen [PSA]: Secondary | ICD-10-CM | POA: Diagnosis not present

## 2024-05-30 DIAGNOSIS — R3912 Poor urinary stream: Secondary | ICD-10-CM | POA: Diagnosis not present

## 2024-05-30 DIAGNOSIS — D75839 Thrombocytosis, unspecified: Secondary | ICD-10-CM

## 2024-05-30 DIAGNOSIS — N401 Enlarged prostate with lower urinary tract symptoms: Secondary | ICD-10-CM | POA: Diagnosis not present

## 2024-05-30 DIAGNOSIS — D473 Essential (hemorrhagic) thrombocythemia: Secondary | ICD-10-CM | POA: Diagnosis not present

## 2024-05-30 DIAGNOSIS — R3914 Feeling of incomplete bladder emptying: Secondary | ICD-10-CM | POA: Diagnosis not present

## 2024-05-30 LAB — CBC WITH DIFFERENTIAL (CANCER CENTER ONLY)
Abs Immature Granulocytes: 0.04 K/uL (ref 0.00–0.07)
Basophils Absolute: 0 K/uL (ref 0.0–0.1)
Basophils Relative: 1 %
Eosinophils Absolute: 0.1 K/uL (ref 0.0–0.5)
Eosinophils Relative: 2 %
HCT: 37.1 % — ABNORMAL LOW (ref 39.0–52.0)
Hemoglobin: 12.4 g/dL — ABNORMAL LOW (ref 13.0–17.0)
Immature Granulocytes: 1 %
Lymphocytes Relative: 11 %
Lymphs Abs: 0.8 K/uL (ref 0.7–4.0)
MCH: 36.4 pg — ABNORMAL HIGH (ref 26.0–34.0)
MCHC: 33.4 g/dL (ref 30.0–36.0)
MCV: 108.8 fL — ABNORMAL HIGH (ref 80.0–100.0)
Monocytes Absolute: 0.5 K/uL (ref 0.1–1.0)
Monocytes Relative: 8 %
Neutro Abs: 5.5 K/uL (ref 1.7–7.7)
Neutrophils Relative %: 77 %
Platelet Count: 335 K/uL (ref 150–400)
RBC: 3.41 MIL/uL — ABNORMAL LOW (ref 4.22–5.81)
RDW: 13.5 % (ref 11.5–15.5)
WBC Count: 7 K/uL (ref 4.0–10.5)
nRBC: 0 % (ref 0.0–0.2)

## 2024-05-30 NOTE — Telephone Encounter (Signed)
 Patient gave verbal understanding and had no further questions.

## 2024-05-30 NOTE — Telephone Encounter (Signed)
-----   Message from Arley Hof sent at 05/30/2024  8:22 AM EST ----- Please call patient, the platelet count is in goal range, continue hydroxyurea  at the current dose, follow-up as scheduled  ----- Message ----- From: Interface, Lab In Axis Sent: 05/30/2024   8:15 AM EST To: Arley KATHEE Hof, MD

## 2024-06-02 ENCOUNTER — Other Ambulatory Visit: Payer: Self-pay | Admitting: Urology

## 2024-06-02 DIAGNOSIS — R972 Elevated prostate specific antigen [PSA]: Secondary | ICD-10-CM

## 2024-06-03 ENCOUNTER — Encounter: Payer: Self-pay | Admitting: Urology

## 2024-06-05 ENCOUNTER — Other Ambulatory Visit: Payer: Self-pay | Admitting: Oncology

## 2024-06-05 DIAGNOSIS — D75839 Thrombocytosis, unspecified: Secondary | ICD-10-CM

## 2024-06-25 ENCOUNTER — Ambulatory Visit (HOSPITAL_BASED_OUTPATIENT_CLINIC_OR_DEPARTMENT_OTHER): Admission: RE | Admit: 2024-06-25 | Discharge: 2024-06-25 | Attending: Acute Care | Admitting: Acute Care

## 2024-06-25 DIAGNOSIS — F17210 Nicotine dependence, cigarettes, uncomplicated: Secondary | ICD-10-CM | POA: Insufficient documentation

## 2024-06-25 DIAGNOSIS — Z122 Encounter for screening for malignant neoplasm of respiratory organs: Secondary | ICD-10-CM | POA: Diagnosis present

## 2024-06-25 DIAGNOSIS — Z87891 Personal history of nicotine dependence: Secondary | ICD-10-CM | POA: Insufficient documentation

## 2024-06-30 ENCOUNTER — Telehealth: Payer: Self-pay

## 2024-06-30 NOTE — Telephone Encounter (Signed)
 [name], NP has reviewed lung screening CT and recommends patient to see PCP to get antibiotic therapy and then repeat CT in 3 months.

## 2024-06-30 NOTE — Telephone Encounter (Signed)
 Call report from Tiffany  IMPRESSION: Lung-RADS 4A, suspicious. Follow up low-dose chest CT without contrast in 3 months (please use the following order, CT CHEST LCS NODULE FOLLOW-UP W/O CM) is recommended. Pleural-based right upper lobe 9.5 mm density which is favored to be post infectious/inflammatory, given morphology and suggestion of involvement of the subtending bronchus. Likely mucoid impaction. Consider antibiotic therapy prior to CT follow-up.   Aortic valvular calcifications. Consider echocardiography to evaluate for valvular dysfunction.   Aortic atherosclerosis (ICD10-I70.0), coronary artery atherosclerosis and emphysema (ICD10-J43.9).

## 2024-07-01 ENCOUNTER — Other Ambulatory Visit: Payer: Self-pay

## 2024-07-01 DIAGNOSIS — Z87891 Personal history of nicotine dependence: Secondary | ICD-10-CM

## 2024-07-01 DIAGNOSIS — F17210 Nicotine dependence, cigarettes, uncomplicated: Secondary | ICD-10-CM

## 2024-07-01 DIAGNOSIS — R911 Solitary pulmonary nodule: Secondary | ICD-10-CM

## 2024-07-01 DIAGNOSIS — Z122 Encounter for screening for malignant neoplasm of respiratory organs: Secondary | ICD-10-CM

## 2024-07-01 MED ORDER — AMOXICILLIN-POT CLAVULANATE 875-125 MG PO TABS: 1.0000 | ORAL_TABLET | Freq: Two times a day (BID) | ORAL | 0 refills | Status: AC

## 2024-07-01 NOTE — Telephone Encounter (Signed)
Please let him know I sent in an antibiotic to his pharmacy.

## 2024-07-01 NOTE — Telephone Encounter (Signed)
 Spoke with patient and spouse. Patient had cough/congestion end of November. States they thought it had cleared up prior to scan. They are in agreement to follow up with Dr. Geofm for treatment and repeat scan 09/2024 to evaluate lung nodule. Order placed and pt request call back in March 2026 to schedule. Results and plan to PCP.   Dr. Polo your office reach out to patient/spouse and discuss treatment. Pt was seen in your office 03/03/2024 and he has follow up 08/2024. They were hoping antibiotics could be called in without him having to come to the office.   Isaiah, RN

## 2024-07-01 NOTE — Addendum Note (Signed)
 Addended by: GEOFM GLADE PARAS on: 07/01/2024 10:53 AM   Modules accepted: Orders

## 2024-07-01 NOTE — Telephone Encounter (Signed)
 Message left for patient today that antibiotic has been sent.

## 2024-07-24 ENCOUNTER — Ambulatory Visit
Admission: RE | Admit: 2024-07-24 | Discharge: 2024-07-24 | Disposition: A | Source: Ambulatory Visit | Attending: Urology

## 2024-07-24 DIAGNOSIS — R972 Elevated prostate specific antigen [PSA]: Secondary | ICD-10-CM

## 2024-07-24 MED ORDER — GADOPICLENOL 0.5 MMOL/ML IV SOLN
8.0000 mL | Freq: Once | INTRAVENOUS | Status: AC | PRN
  Administered 2024-07-24: 8 mL via INTRAVENOUS

## 2024-09-03 ENCOUNTER — Ambulatory Visit: Admitting: Internal Medicine

## 2024-09-04 ENCOUNTER — Ambulatory Visit: Admitting: Oncology

## 2024-09-04 ENCOUNTER — Other Ambulatory Visit

## 2024-09-24 ENCOUNTER — Encounter (HOSPITAL_BASED_OUTPATIENT_CLINIC_OR_DEPARTMENT_OTHER)
